# Patient Record
Sex: Male | Born: 1985
Health system: Southern US, Community
[De-identification: ages and names within clinical notes are randomized; demographics above are authoritative.]

## PROBLEM LIST (undated history)

## (undated) DIAGNOSIS — I951 Orthostatic hypotension: Secondary | ICD-10-CM

## (undated) DIAGNOSIS — I498 Other specified cardiac arrhythmias: Secondary | ICD-10-CM

## (undated) DIAGNOSIS — G90A Postural orthostatic tachycardia syndrome (POTS): Secondary | ICD-10-CM

## (undated) DIAGNOSIS — R000 Tachycardia, unspecified: Secondary | ICD-10-CM

## (undated) DIAGNOSIS — T7840XA Allergy, unspecified, initial encounter: Secondary | ICD-10-CM

## (undated) HISTORY — PX: FOOT SURGERY: SHX648

## (undated) HISTORY — DX: Allergy, unspecified, initial encounter: T78.40XA

## (undated) HISTORY — PX: WISDOM TOOTH EXTRACTION: SHX21

## (undated) HISTORY — PX: NOSE SURGERY: SHX723

---
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
## 2007-12-19 ENCOUNTER — Ambulatory Visit: Payer: Self-pay | Admitting: Family Medicine

## 2007-12-30 ENCOUNTER — Encounter: Payer: Self-pay | Admitting: Internal Medicine

## 2008-01-08 ENCOUNTER — Ambulatory Visit: Payer: Self-pay | Admitting: Family Medicine

## 2008-02-16 ENCOUNTER — Ambulatory Visit: Payer: Self-pay | Admitting: Family Medicine

## 2008-02-16 DIAGNOSIS — S90569A Insect bite (nonvenomous), unspecified ankle, initial encounter: Secondary | ICD-10-CM

## 2008-02-16 DIAGNOSIS — W57XXXA Bitten or stung by nonvenomous insect and other nonvenomous arthropods, initial encounter: Secondary | ICD-10-CM | POA: Insufficient documentation

## 2008-04-30 ENCOUNTER — Ambulatory Visit: Payer: Self-pay | Admitting: Internal Medicine

## 2008-04-30 DIAGNOSIS — I499 Cardiac arrhythmia, unspecified: Secondary | ICD-10-CM | POA: Insufficient documentation

## 2008-04-30 DIAGNOSIS — R5383 Other fatigue: Secondary | ICD-10-CM

## 2008-04-30 DIAGNOSIS — R5381 Other malaise: Secondary | ICD-10-CM

## 2008-05-14 ENCOUNTER — Ambulatory Visit: Payer: Self-pay | Admitting: Internal Medicine

## 2008-06-01 ENCOUNTER — Ambulatory Visit: Payer: Self-pay | Admitting: Family Medicine

## 2008-06-01 LAB — CONVERTED CEMR LAB
Bacteria, UA: 0
Bilirubin Urine: NEGATIVE
Glucose, Urine, Semiquant: NEGATIVE
Ketones, urine, test strip: NEGATIVE
Specific Gravity, Urine: >=1.03
Urobilinogen, UA: 0.2
pH: 5

## 2008-06-25 ENCOUNTER — Ambulatory Visit: Payer: Self-pay | Admitting: Internal Medicine

## 2009-01-19 ENCOUNTER — Ambulatory Visit: Payer: Self-pay | Admitting: Family Medicine

## 2009-01-27 ENCOUNTER — Telehealth: Payer: Self-pay | Admitting: Family Medicine

## 2009-03-17 ENCOUNTER — Emergency Department (HOSPITAL_COMMUNITY): Admission: EM | Admit: 2009-03-17 | Discharge: 2009-03-17 | Payer: Self-pay | Admitting: Emergency Medicine

## 2010-05-13 ENCOUNTER — Emergency Department (HOSPITAL_COMMUNITY): Admission: EM | Admit: 2010-05-13 | Discharge: 2010-05-13 | Payer: Self-pay | Admitting: Emergency Medicine

## 2010-11-12 LAB — CONVERTED CEMR LAB
ALT: 31 units/L (ref 0–53)
AST: 25 units/L (ref 0–37)
Alkaline Phosphatase: 54 units/L (ref 39–117)
Bilirubin, Direct: 0.1 mg/dL (ref 0.0–0.3)
CO2: 31 meq/L (ref 19–32)
Calcium: 9.8 mg/dL (ref 8.4–10.5)
Cholesterol: 172 mg/dL (ref 0–200)
Direct LDL: 108.5 mg/dL
GFR calc Af Amer: 123 mL/min
GFR calc non Af Amer: 101 mL/min
Glucose, Bld: 89 mg/dL (ref 70–99)
HCT: 48.6 % (ref 39.0–52.0)
HDL: 27.3 mg/dL — ABNORMAL LOW (ref >39.0)
Monocytes Absolute: 0.8 10*3/uL (ref 0.1–1.0)
Monocytes Relative: 9.9 % (ref 3.0–12.0)
Neutro Abs: 5.2 10*3/uL (ref 1.4–7.7)
Neutrophils Relative %: 61.7 % (ref 43.0–77.0)
Platelets: 229 10*3/uL (ref 150–400)
RDW: 11.5 % (ref 11.5–14.6)
TSH: 0.64 microintl units/mL (ref 0.35–5.50)
Total Bilirubin: 0.9 mg/dL (ref 0.3–1.2)
Total Protein: 7.2 g/dL (ref 6.0–8.3)
WBC, blood: 7.1 10*3/uL

## 2011-01-22 LAB — COMPREHENSIVE METABOLIC PANEL
BUN: 17 mg/dL (ref 6–23)
CO2: 23 mEq/L (ref 19–32)
Calcium: 9.1 mg/dL (ref 8.4–10.5)
Glucose, Bld: 93 mg/dL (ref 70–99)
Sodium: 137 mEq/L (ref 135–145)
Total Bilirubin: 1.1 mg/dL (ref 0.3–1.2)

## 2011-01-22 LAB — DIFFERENTIAL
Basophils Absolute: 0 10*3/uL (ref 0.0–0.1)
Basophils Relative: 0 % (ref 0–1)
Lymphs Abs: 1.3 10*3/uL (ref 0.7–4.0)
Monocytes Absolute: 0.6 10*3/uL (ref 0.1–1.0)
Neutro Abs: 8.8 10*3/uL — ABNORMAL HIGH (ref 1.7–7.7)
Neutrophils Relative %: 81 % — ABNORMAL HIGH (ref 43–77)

## 2011-01-22 LAB — LIPASE, BLOOD: Lipase: 21 U/L (ref 11–59)

## 2011-01-22 LAB — CBC
HCT: 45.8 % (ref 39.0–52.0)
MCV: 87.1 fL (ref 78.0–100.0)
RBC: 5.26 MIL/uL (ref 4.22–5.81)

## 2011-02-27 NOTE — Letter (Signed)
May 14, 2008    Billie D. Jillyn Hidden, FNP  [address] [place], [place] [address]   RE:  [name], [name]  MRN:  [id number]  /  DOB:  December 30, 1985   Dear Willaim Sheng:   Thanks very much for asking [place] to see Mike Nolan who came in today  with his mother regarding his spells.   As you know, he is a 25 year old whose sister died of, what was  apparently, a postpartum cardiomyopathy, although these details are not  available.   Mike Nolan has had spells over the last number of months that are  characterized initially by his fingers tingling.  He develops a  headache, sensation that his heart is beating, and he describes this is  at about 90-100 beats per minute.  He gets short of breath.  He gets  quite pale, diaphoretic, and has to lie down.  If he gets up too fast,  he gets lightheaded again and he needs to stay flat.   He also has a history of orthostatic intolerance which goes back a  number of months longer than this, which can be quite profound.  It can  also be associated with lightheadedness and shortness of breath in the  shower.   These 3 most severe episodes that can last up to 10-30 minutes have  occurred in the following situations:  1. At a go-cart race outside, it was very hot and he had been drinking      number of beers.  2. During sex, although he had just taken a phosphodiesterase      inhibitor.  3. The third episode occurred getting out of the [place].  He has had      problems with Jacuzzi intolerance before.   His diet is salt deplete as well as volume deplete.  He does use some  caffeine; though he is trying to cut it now.   PAST MEDICAL HISTORY:  Largely unrelated.   FAMILY HISTORY:  Striking, as relates to above.  He was evaluated at the  time of his sister's death with an echo that was normal and  electrocardiogram that was normal.   PAST SURGICAL HISTORY:  Notable for wisdom teeth and myringotomy tubes.   MEDICATIONS:  He takes no medications.   ALLERGIES:  He has no known drug allergies.   SOCIAL HISTORY:  He smokes just a couple of cigarettes a day.  He does  not use recreational drugs.  He does drink alcohol occasionally.  He is  single, and I did not ask him about his occupation but he did not write  anything down.   PHYSICAL EXAMINATION:  GENERAL:  He is a young Caucasian male appearing  his stated age of 48.  VITAL SIGNS:  His blood pressure is 123/76 with a pulse of 66 lying,  standing at 0 minute, the blood pressure was stable with a pulse of 82  and at 5 minutes, the pulse has gone to 90 with a blood pressure 125/85,  and his feet gained a blue tinge.  HEENT:  Demonstrated no icterus or xanthoma.  NECK:  Veins were flat.  Carotids were brisk and full bilaterally  without bruits.  BACK:  Without kyphosis and scoliosis.  LUNGS:  Clear.  HEART:  Sounds were regular without murmurs or gallops.  ABDOMEN:  Soft with active bowel sounds without midline pulsation or  hepatomegaly.  Femoral pulses were 2+ and distal pulses were intact.  EXTREMITIES:  There was  no clubbing, cyanosis, or edema.  SKIN:  Warm and dry, although there were some unusual body jewelry.  The  extremities were without edema.  NEUROLOGICAL:  Grossly normal.   Electrocardiogram dated, which I think is April 30, 2008, demonstrated  sinus rhythm at 57 with intervals of 0.14/0.11/0.38.  Electrocardiogram  was normal.   IMPRESSION:  1. 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  2. Family history of death in his sister as a consequence of a      cardiomyopathy, the mechanism of which is not clear and hence good      deal of related anxiety.  3. Normal electrocardiogram.   [name], I suspect, is dysautonomic.  I have given him the  information from [facility] and [employer] to look at to see if this does  not feel  consistent with his symptoms, but his story certainly suggests  this.  The provocative situations support this as well as his  orthostatic intolerance.  His diet is salt deplete and volume deplete,  and so the primary goal of therapy is volume repletion and salt  repletion and the avoidance of diuretics including in the form of  caffeine.   I have gone over this with him.  Because of the family history, we will  plan to get an event recorder to make sure that there is not an  arrhythmic cause that could be functioning as a trigger.  I will plan to  sit down with the family to review this in 4 weeks' time.   Thank you very much for the consultation.    Sincerely,      Duke Salvia, MD, [facility]  Electronically Signed    SCK/MedQ  DD: 05/14/2008  DT: 05/15/2008  Job #: [id number]

## 2011-02-27 NOTE — Letter (Signed)
June 25, 2008    Billie D. Jillyn Hidden, FNP  [address] [place], [place] [address]   RE:  KERWIN, AUGUSTUS  MRN:  [id number]  /  DOB:  1986/04/05   Dear Willaim Sheng,   It is a pleasure seeing Phelix Fudala today.  He is doing better somewhat  with fewer palpitations.  We did use event recorder and these episodes  of typical palpitations were associated with sinus rhythm probably in  the rates of 80-120.   He is somewhat more volume and salt repleted.  He mentioned that you had  told him that he should limit his Gatorade intake.  I am not sure that  you may not know more about rehydration or something since having heard  that or no.  The way I think of it is sodium absorption in the gut  requires glucose in vitro transport.  There are issues with caloric  intake and we did talk about that.   He is going to continue to work on salt and water repletion.  I told him  that I will be glad to see him again either as needed or scheduled, he  preferred the former.   Should note that on examination today his weight was up 7 pounds from  227 to 234.  His blood pressure was 119/74.  His pulse was 75.  His  lungs were clear.  His heart sounds were regular.  Extremities were  without edema.   Please let me know if there is any further I could do to help.    Sincerely,      Duke Salvia, MD, [facility]  Electronically Signed    SCK/MedQ  DD: 06/25/2008  DT: 06/26/2008  Job #: [id number]

## 2012-04-17 ENCOUNTER — Emergency Department (HOSPITAL_COMMUNITY): Payer: Medicaid Other

## 2012-04-17 ENCOUNTER — Emergency Department (HOSPITAL_COMMUNITY)
Admission: EM | Admit: 2012-04-17 | Discharge: 2012-04-17 | Disposition: A | Discharge status: Home / Self Care | Payer: Medicaid Other | Attending: Emergency Medicine | Admitting: Emergency Medicine

## 2012-04-17 ENCOUNTER — Encounter (HOSPITAL_COMMUNITY): Payer: Self-pay | Admitting: *Deleted

## 2012-04-17 DIAGNOSIS — G8929 Other chronic pain: Secondary | ICD-10-CM | POA: Insufficient documentation

## 2012-04-17 DIAGNOSIS — T622X1A Toxic effect of other ingested (parts of) plant(s), accidental (unintentional), initial encounter: Secondary | ICD-10-CM | POA: Insufficient documentation

## 2012-04-17 DIAGNOSIS — L237 Allergic contact dermatitis due to plants, except food: Secondary | ICD-10-CM

## 2012-04-17 DIAGNOSIS — L255 Unspecified contact dermatitis due to plants, except food: Secondary | ICD-10-CM | POA: Insufficient documentation

## 2012-04-17 DIAGNOSIS — F17200 Nicotine dependence, unspecified, uncomplicated: Secondary | ICD-10-CM | POA: Insufficient documentation

## 2012-04-17 MED ORDER — DIPHENHYDRAMINE HCL 25 MG PO CAPS
25.0000 mg | ORAL_CAPSULE | Freq: Once | ORAL | Status: AC
  Administered 2012-04-17: 25 mg via ORAL
  Filled 2012-04-17: qty 1

## 2012-04-17 MED ORDER — NAPROXEN 500 MG PO TABS: 500.0000 mg | ORAL_TABLET | Freq: Two times a day (BID) | ORAL | Status: DC

## 2012-04-17 MED ORDER — DIPHENHYDRAMINE HCL 25 MG PO TABS: 25.0000 mg | ORAL_TABLET | Freq: Four times a day (QID) | ORAL | Status: DC

## 2012-04-17 NOTE — ED Provider Notes (Signed)
History   This chart was scribed for Mike Octave, MD by Toya Smothers. The patient was seen in room TR04C/TR04C. Patient's care was started at 1836.  CSN: [id number]  Arrival date & time 04/17/12  1836   First MD Initiated Contact with Patient 04/17/12 1901      Chief Complaint  Patient presents with  . multiple complaints    HPI  Mike Nolan is a 26 y.o. male who presents to the Emergency Department complaining of gradual onset moderate severe constant rash onset 4 days ago of which he believes to be poison oak. He has be taking cortisone with no relief. Pt also c/o chronic radiating lower back pain onset several years of which he has been taking tramadol. Pt states that tramadol is not working. Pt also c/o L knee pain onset a few years ago. Pt lists a h/o POTS and Surgery in L foot.  Pt lists PCP as Dr. Juanetta Snow   History reviewed. No pertinent past medical history.  History reviewed. No pertinent past surgical history.  No family history on file.  History  Substance Use Topics  . Smoking status: Current Everyday Smoker  . Smokeless tobacco: Not on file  . Alcohol Use: Yes    Review of Systems  Constitutional: Negative for fever and chills.  HENT: Negative for rhinorrhea and neck pain.   Eyes: Negative for pain.  Respiratory: Negative for cough and shortness of breath.   Cardiovascular: Negative for chest pain.  Gastrointestinal: Negative for nausea, vomiting, abdominal pain and diarrhea.  Genitourinary: Negative for dysuria.  Musculoskeletal: Positive for back pain and arthralgias (L knee).  Skin: Positive for rash.       Redness.  Neurological: Negative for dizziness and weakness.    Allergies  Review of patient's allergies indicates no known allergies.  Home Medications   Current Outpatient Rx  Name Route Sig Dispense Refill  . CETIRIZINE HCL 10 MG PO TABS Oral Take 10 mg by mouth daily.    . TRAMADOL HCL 50 MG PO TABS Oral Take 50 mg by mouth every  6 (six) hours as needed. For pain    . DIPHENHYDRAMINE HCL 25 MG PO TABS Oral Take 1 tablet (25 mg total) by mouth every 6 (six) hours. 20 tablet 0  . NAPROXEN 500 MG PO TABS Oral Take 1 tablet (500 mg total) by mouth 2 (two) times daily. 30 tablet 0    BP 118/66  Pulse 58  Temp 98.2 F (36.8 C) (Oral)  Resp 16  SpO2 98%  Physical Exam  Nursing note and vitals reviewed. Constitutional: He is oriented to person, place, and time.       Awake, alert, nontoxic appearance with baseline speech for patient.  HENT:  Head: Atraumatic.  Mouth/Throat: No oropharyngeal exudate.  Eyes: EOM are normal. Pupils are equal, round, and reactive to light. Right eye exhibits no discharge. Left eye exhibits no discharge.  Neck: Neck supple.  Cardiovascular: Normal rate and regular rhythm.   No murmur heard. Pulmonary/Chest: Effort normal and breath sounds normal. No stridor. No respiratory distress.  Abdominal: Soft. Bowel sounds are normal. He exhibits no distension and no mass. There is no tenderness. There is no rebound.  Musculoskeletal: He exhibits no tenderness.       5/5 strength throughout. Gt extension bilaterally. +2 pulses. L knee diffusely tender to palpation without overlying skin deformity.  Lymphadenopathy:    He has no cervical adenopathy.  Neurological: He is alert and oriented to  person, place, and time. No cranial nerve deficit. Coordination normal.  Skin:       Erythematosa vesicular rash of the bilateral shins  Psychiatric: He has a normal mood and affect.    ED Course  Procedures (including critical care time) DIAGNOSTIC STUDIES: Oxygen Saturation is 98% on room air, normal by my interpretation.    COORDINATION OF CARE: 1912- Evaluated Pt's present illness. Discussed referrals to another PCP.   Labs Reviewed - No data to display Dg Hip Complete Left  04/17/2012  *RADIOLOGY REPORT*  Clinical Data: Chronic posterior left hip pain.  LEFT HIP - COMPLETE 2+ VIEW  Comparison:  None.  Findings: Hip joints and SI joints are symmetric and unremarkable. No acute bony abnormality.  Specifically, no fracture, subluxation, or dislocation.  Soft tissues are intact.  IMPRESSION: Normal study.  Original Report Authenticated By: Cyndie Chime, M.D.   Dg Knee Complete 4 Views Left  04/17/2012  *RADIOLOGY REPORT*  Clinical Data: Chronic patellar pain.  LEFT KNEE - COMPLETE 4+ VIEW  Comparison: None.  Findings: Mild irregularity at the anterior tibial tuberosity. No acute bony abnormality.  Specifically, no fracture, subluxation, or dislocation.  Soft tissues are intact.  No joint effusion.  IMPRESSION: No acute bony abnormality.  Original Report Authenticated By: Cyndie Chime, M.D.     1. Poison ivy dermatitis   2. Chronic pain       MDM  Itchy red rash to BLE x 3 days after getting in poison ivy.  No SOB or wheezing.  Using triamcinolone and prednisone. Also c/o intermittent knee pain for "years" and L hip pain since "falling in seventh grade".  No neuro deficits. Goals and expectations of ED visit set.  Will treat acute problems and rule out life threatening conditions.     I personally performed the services described in this documentation, which was scribed in my presence.  The recorded information has been reviewed and considered.    Mike Octave, MD 04/17/12 2009

## 2012-04-17 NOTE — ED Notes (Signed)
The pt has poison ivy rash for 3 days lower back pain for months and lt knee pain for months also

## 2012-04-18 ENCOUNTER — Emergency Department (HOSPITAL_COMMUNITY)
Admission: EM | Admit: 2012-04-18 | Discharge: 2012-04-18 | Disposition: A | Discharge status: Home / Self Care | Payer: Medicaid Other | Attending: Emergency Medicine | Admitting: Emergency Medicine

## 2012-04-18 ENCOUNTER — Encounter (HOSPITAL_COMMUNITY): Payer: Self-pay | Admitting: *Deleted

## 2012-04-18 DIAGNOSIS — L255 Unspecified contact dermatitis due to plants, except food: Secondary | ICD-10-CM

## 2012-04-18 DIAGNOSIS — T622X1A Toxic effect of other ingested (parts of) plant(s), accidental (unintentional), initial encounter: Secondary | ICD-10-CM | POA: Insufficient documentation

## 2012-04-18 HISTORY — DX: Orthostatic hypotension: I95.1

## 2012-04-18 HISTORY — DX: Tachycardia, unspecified: R00.0

## 2012-04-18 HISTORY — DX: Other specified cardiac arrhythmias: I49.8

## 2012-04-18 HISTORY — DX: Postural orthostatic tachycardia syndrome (POTS): G90.A

## 2012-04-18 MED ORDER — DEXAMETHASONE SODIUM PHOSPHATE 4 MG/ML IJ SOLN
10.0000 mg | Freq: Once | INTRAMUSCULAR | Status: AC
  Administered 2012-04-18: 10 mg via INTRAMUSCULAR
  Filled 2012-04-18: qty 3

## 2012-04-18 NOTE — ED Notes (Signed)
Poison ivy rash noted to both legs, c/o itching

## 2012-04-18 NOTE — ED Provider Notes (Signed)
History     CSN: [id number]  Arrival date & time 04/18/12  2144   First MD Initiated Contact with Patient 04/18/12 2157      Chief Complaint  Patient presents with  . Poison Oak    (Consider location/radiation/quality/duration/timing/severity/associated sxs/prior treatment) HPI Comments: patient c/o itching and rash to his bilateral arms and legs for 4 days.  States he was recently seen at another ER and given benadryl and has been using triamcinolone cream without improvement.  He is requesting a "shot of steroids".  He denies fever, pain, swelling, wheezing or shortness of breath  Patient is a 26 y.o. male presenting with rash. The history is provided by the patient.  Rash  This is a new problem. The current episode started more than 2 days ago. The problem has not changed since onset.The problem is associated with plant contact. There has been no fever. The rash is present on the left lower leg, left upper leg, right lower leg, right upper leg, right arm and left arm. The patient is experiencing no pain. Associated symptoms include itching. Pertinent negatives include no blisters, no pain and no weeping. He has tried anti-itch cream and antihistamines for the symptoms. The treatment provided no relief.    Past Medical History  Diagnosis Date  . Postural orthostatic tachycardia syndrome     Past Surgical History  Procedure Date  . Foot surgery   . Nose surgery   . Wisdom tooth extraction     No family history on file.  History  Substance Use Topics  . Smoking status: Current Everyday Smoker  . Smokeless tobacco: Not on file  . Alcohol Use: Yes      Review of Systems  Constitutional: Negative for fever, chills, activity change and appetite change.  HENT: Negative for sore throat, facial swelling, trouble swallowing, neck pain and neck stiffness.   Respiratory: Negative for chest tightness, shortness of breath and wheezing.   Musculoskeletal: Negative for arthralgias.    Skin: Positive for itching and rash. Negative for wound.  Neurological: Negative for dizziness, weakness, numbness and headaches.  All other systems reviewed and are negative.    Allergies  Honey bee treatment  Home Medications   Current Outpatient Rx  Name Route Sig Dispense Refill  . CETIRIZINE HCL 10 MG PO TABS Oral Take 10 mg by mouth daily.    Marland Kitchen DIPHENHYDRAMINE HCL 25 MG PO TABS Oral Take 1 tablet (25 mg total) by mouth every 6 (six) hours. 20 tablet 0  . ESCITALOPRAM OXALATE 20 MG PO TABS Oral Take 20 mg by mouth daily.    Marland Kitchen HYDROCORTISONE 1 % EX CREA Topical Apply 1 application topically daily as needed.    Marland Kitchen TRAMADOL HCL 50 MG PO TABS Oral Take 50 mg by mouth every 6 (six) hours as needed. For pain      BP 110/56  Pulse 75  Temp 98 F (36.7 C)  Resp 20  Ht 6' (1.829 m)  Wt 224 lb (101.606 kg)  BMI 30.38 kg/m2  SpO2 96%  Physical Exam  Nursing note and vitals reviewed. Constitutional: He is oriented to person, place, and time. He appears well-developed and well-nourished. No distress.  HENT:  Head: Normocephalic and atraumatic.  Mouth/Throat: Oropharynx is clear and moist.  Neck: Normal range of motion. Neck supple.  Cardiovascular: Normal rate, regular rhythm and normal heart sounds.   Pulmonary/Chest: Effort normal and breath sounds normal.  Musculoskeletal: He exhibits no edema and no tenderness.  Lymphadenopathy:  He has no cervical adenopathy.  Neurological: He is alert and oriented to person, place, and time. He exhibits normal muscle tone. Coordination normal.  Skin: Rash noted. There is erythema.       Erythematous, vesicular rash to bilateral LE"s and upper arms.  No edema.  No draiange or pustules    ED Course  Procedures (including critical care time)  Labs Reviewed - No data to display Dg Hip Complete Left  04/17/2012  *RADIOLOGY REPORT*  Clinical Data: Chronic posterior left hip pain.  LEFT HIP - COMPLETE 2+ VIEW  Comparison: None.  Findings:  Hip joints and SI joints are symmetric and unremarkable. No acute bony abnormality.  Specifically, no fracture, subluxation, or dislocation.  Soft tissues are intact.  IMPRESSION: Normal study.  Original Report Authenticated By: Cyndie Chime, M.D.   Dg Knee Complete 4 Views Left  04/17/2012  *RADIOLOGY REPORT*  Clinical Data: Chronic patellar pain.  LEFT KNEE - COMPLETE 4+ VIEW  Comparison: None.  Findings: Mild irregularity at the anterior tibial tuberosity. No acute bony abnormality.  Specifically, no fracture, subluxation, or dislocation.  Soft tissues are intact.  No joint effusion.  IMPRESSION: No acute bony abnormality.  Original Report Authenticated By: Cyndie Chime, M.D.        MDM  Patient seen in ER yesterday for similar symptoms. ED chart and imaging were reviewed by me.  Patchy ,vesicular lesions to the bilateral lower extremities.  Using triamcinolone cream without improvement. Currently taking Benadryl for the itching. No edema, shortness of breath, wheezing or drainage.   Patient / Family / Caregiver understand and agree with initial ED impression and plan with expectations set for ED visit. Pt stable in ED with no significant deterioration in condition. Pt feels improved after observation and/or treatment in ED.     Given Decadron IM .  Agrees to f/u with his PMD for recheck  Rmoni Keplinger L. Meyers, [place] 04/22/12 2157

## 2012-04-18 NOTE — ED Notes (Signed)
?   Poison oak to bilateral lower legs and arms, started 4 days ago after he was out working in the yard

## 2012-04-23 NOTE — ED Provider Notes (Signed)
Medical screening examination/treatment/procedure(s) were performed by non-physician practitioner and as supervising physician I was immediately available for consultation/collaboration.   Laray Anger, DO 04/23/12 1248

## 2012-08-19 ENCOUNTER — Encounter (HOSPITAL_COMMUNITY): Payer: Self-pay

## 2012-08-19 ENCOUNTER — Emergency Department (HOSPITAL_COMMUNITY)
Admission: EM | Admit: 2012-08-19 | Discharge: 2012-08-19 | Disposition: A | Discharge status: Home / Self Care | Payer: Medicaid Other | Attending: Emergency Medicine | Admitting: Emergency Medicine

## 2012-08-19 DIAGNOSIS — M545 Low back pain, unspecified: Secondary | ICD-10-CM | POA: Insufficient documentation

## 2012-08-19 DIAGNOSIS — F17200 Nicotine dependence, unspecified, uncomplicated: Secondary | ICD-10-CM | POA: Insufficient documentation

## 2012-08-19 DIAGNOSIS — M549 Dorsalgia, unspecified: Secondary | ICD-10-CM

## 2012-08-19 DIAGNOSIS — Z79899 Other long term (current) drug therapy: Secondary | ICD-10-CM | POA: Insufficient documentation

## 2012-08-19 DIAGNOSIS — Z8679 Personal history of other diseases of the circulatory system: Secondary | ICD-10-CM | POA: Insufficient documentation

## 2012-08-19 MED ORDER — MELOXICAM 7.5 MG PO TABS: ORAL_TABLET | ORAL | Status: DC

## 2012-08-19 MED ORDER — HYDROCODONE-ACETAMINOPHEN 5-325 MG PO TABS: 1.0000 | ORAL_TABLET | ORAL | Status: DC | PRN

## 2012-08-19 MED ORDER — METHOCARBAMOL 500 MG PO TABS: ORAL_TABLET | ORAL | Status: DC

## 2012-08-19 NOTE — ED Provider Notes (Signed)
History     CSN: [id number]  Arrival date & time 08/19/12  1251   First MD Initiated Contact with Patient 08/19/12 1331      No chief complaint on file.   (Consider location/radiation/quality/duration/timing/severity/associated sxs/prior treatment) Patient is a 26 y.o. male presenting with back pain. The history is provided by the patient.  Back Pain  This is a new problem. The current episode started 2 days ago. The problem has not changed since onset.The pain is associated with lifting heavy objects. The pain is present in the lumbar spine. The quality of the pain is described as aching. The pain radiates to the left thigh. The pain is severe. The symptoms are aggravated by bending. The pain is the same all the time. Pertinent negatives include no chest pain, no abdominal pain and no dysuria. He has tried analgesics (tramadol) for the symptoms. The treatment provided no relief.    Past Medical History  Diagnosis Date  . Postural orthostatic tachycardia syndrome     Past Surgical History  Procedure Date  . Foot surgery   . Nose surgery   . Wisdom tooth extraction     No family history on file.  History  Substance Use Topics  . Smoking status: Current Every Day Smoker  . Smokeless tobacco: Not on file  . Alcohol Use: No      Review of Systems  Constitutional: Negative for activity change.       All ROS Neg except as noted in HPI  HENT: Negative for nosebleeds and neck pain.   Eyes: Negative for photophobia and discharge.  Respiratory: Negative for cough, shortness of breath and wheezing.   Cardiovascular: Negative for chest pain and palpitations.  Gastrointestinal: Negative for abdominal pain and blood in stool.  Genitourinary: Negative for dysuria, frequency and hematuria.  Musculoskeletal: Positive for back pain. Negative for arthralgias.  Skin: Negative.   Neurological: Negative for dizziness, seizures and speech difficulty.  Psychiatric/Behavioral: Negative  for hallucinations and confusion.    Allergies  Bee venom  Home Medications   Current Outpatient Rx  Name  Route  Sig  Dispense  Refill  . VITAMIN C 1000 MG PO TABS   Oral   Take 1,000 mg by mouth daily.         . BUPROPION HCL ER (XL) 150 MG PO TB24   Oral   Take 150 mg by mouth daily.         . DAYQUIL PO   Oral   Take 2 capsules by mouth every 4 (four) hours as needed. Cold Symptoms           BP 135/77  Pulse 74  Temp 97.9 F (36.6 C) (Oral)  Resp 20  Ht 6' (1.829 m)  Wt 230 lb (104.327 kg)  BMI 31.19 kg/m2  SpO2 98%  Physical Exam  Nursing note and vitals reviewed. Constitutional: He is oriented to person, place, and time. He appears well-developed and well-nourished.  Non-toxic appearance.  HENT:  Head: Normocephalic.  Right Ear: Tympanic membrane and external ear normal.  Left Ear: Tympanic membrane and external ear normal.  Eyes: EOM and lids are normal. Pupils are equal, round, and reactive to light.  Neck: Normal range of motion. Neck supple. Carotid bruit is not present.  Cardiovascular: Normal rate, regular rhythm, normal heart sounds, intact distal pulses and normal pulses.   Pulmonary/Chest: Breath sounds normal. No respiratory distress.  Abdominal: Soft. Bowel sounds are normal. There is no tenderness. There is no  guarding.  Musculoskeletal: Normal range of motion.       Pain of the lumbar spine and Paraspinous area with ROM exercises. No hot areas   Lymphadenopathy:       Head (right side): No submandibular adenopathy present.       Head (left side): No submandibular adenopathy present.    He has no cervical adenopathy.  Neurological: He is alert and oriented to person, place, and time. He has normal strength. No cranial nerve deficit or sensory deficit.       Sensory is symmetrical. Gait is steady. No focal deficit.  Skin: Skin is warm and dry.  Psychiatric: He has a normal mood and affect. His speech is normal.    ED Course  Procedures  (including critical care time)  Labs Reviewed - No data to display No results found. Pulse oximetry is 98% on room air. Within normal limits by my interpretation.  No diagnosis found.    MDM  I have reviewed nursing notes, vital signs, and all appropriate lab and imaging results for this patient. Patient was lifting heavy objects this weekend and on the following day noted a pain in the lower back at times radiating into the left leg. He denies any foot drop. She's not had any falls or injury. Examination does not show any gross neurologic deficits at this time. The patient is treated with Robaxin 3 times daily, Mobic 7.5 mg 2 times daily with food, and Norco #20 tablets. The patient is to see orthopedics for evaluation if not improving.       Kathie Dike, [place] 08/20/12 [phone]

## 2012-08-19 NOTE — ED Notes (Signed)
Pt reports was helping a friend move this weekend and has had pain in lower back radiating down left leg since.

## 2012-08-20 NOTE — ED Provider Notes (Signed)
Medical screening examination/treatment/procedure(s) were performed by non-physician practitioner and as supervising physician I was immediately available for consultation/collaboration.   Kayce Betty L Brenten Janney, MD 08/20/12 1059 

## 2014-07-30 ENCOUNTER — Emergency Department (HOSPITAL_COMMUNITY): Payer: Self-pay

## 2014-07-30 ENCOUNTER — Encounter (HOSPITAL_COMMUNITY): Payer: Self-pay | Admitting: Emergency Medicine

## 2014-07-30 ENCOUNTER — Emergency Department (HOSPITAL_COMMUNITY)
Admission: EM | Admit: 2014-07-30 | Discharge: 2014-07-31 | Disposition: A | Discharge status: Home / Self Care | Payer: Self-pay | Attending: Emergency Medicine | Admitting: Emergency Medicine

## 2014-07-30 DIAGNOSIS — Z791 Long term (current) use of non-steroidal anti-inflammatories (NSAID): Secondary | ICD-10-CM | POA: Insufficient documentation

## 2014-07-30 DIAGNOSIS — S060X1A Concussion with loss of consciousness of 30 minutes or less, initial encounter: Secondary | ICD-10-CM | POA: Insufficient documentation

## 2014-07-30 DIAGNOSIS — Z79899 Other long term (current) drug therapy: Secondary | ICD-10-CM | POA: Insufficient documentation

## 2014-07-30 DIAGNOSIS — Y9364 Activity, baseball: Secondary | ICD-10-CM | POA: Insufficient documentation

## 2014-07-30 DIAGNOSIS — W01198A Fall on same level from slipping, tripping and stumbling with subsequent striking against other object, initial encounter: Secondary | ICD-10-CM | POA: Insufficient documentation

## 2014-07-30 DIAGNOSIS — Z8679 Personal history of other diseases of the circulatory system: Secondary | ICD-10-CM | POA: Insufficient documentation

## 2014-07-30 DIAGNOSIS — Y92310 Basketball court as the place of occurrence of the external cause: Secondary | ICD-10-CM | POA: Insufficient documentation

## 2014-07-30 NOTE — ED Provider Notes (Signed)
CSN: [id number]     Arrival date & time 07/30/14  2214 History   First MD Initiated Contact with Patient 07/30/14 2243    This chart was scribed for Glynn Octave, MD by Marica Otter, ED Scribe. This patient was seen in room APA05/[facility]05 and the patient's care was started at 10:52 PM.  Chief Complaint  Patient presents with  . Head Injury   The history is provided by the patient and a relative. No language interpreter was used.   PCP: No PCP Per Patient HPI Comments: Mike Nolan is a 28 y.o. male who presents to the Emergency Department complaining of a fall and associated head trauma sustained earlier this evening while playing softball. Pt was in the process of catching a ball when he fell backwards and hit the back of his head on the ground. Pt did not get hit in the head with the baseball. Pt cannot recall any of the specifics of the fall. Per family member, pt got up following the fall and returned to the game; however, pt could not recall what position he played. Per family member, pt insisted on driving home following the game, however, had difficulty remembering how to get home. Pt complains of mild neck pain and pain to the right side of the forehead. Per spouse, pt complained of a HA prior to arrival, however, pt now denies any HA. Pt denies n/v, dizziness, light headedness, blurred vision, double vision.   Past Medical History  Diagnosis Date  . Postural orthostatic tachycardia syndrome    Past Surgical History  Procedure Laterality Date  . Foot surgery    . Nose surgery    . Wisdom tooth extraction     History reviewed. No pertinent family history. History  Substance Use Topics  . Smoking status: Current Every Day Smoker  . Smokeless tobacco: Not on file  . Alcohol Use: No    Review of Systems  A complete 10 system review of systems was obtained and all systems are negative except as noted in the HPI and PMH.    Allergies  Bee venom  Home Medications   Prior  to Admission medications   Medication Sig Start Date End Date Taking? Authorizing Provider  Ascorbic Acid (VITAMIN C) 1000 MG tablet Take 1,000 mg by mouth daily.    Historical Provider, MD  buPROPion (WELLBUTRIN XL) 150 MG 24 hr tablet Take 150 mg by mouth daily.    Historical Provider, MD  HYDROcodone-acetaminophen (NORCO) 5-325 MG per tablet Take 1 tablet by mouth every 4 (four) hours as needed for pain. 08/19/12   Kathie Dike, PA-C  meloxicam (MOBIC) 7.5 MG tablet 1 po bid with food 08/19/12   Kathie Dike, PA-C  methocarbamol (ROBAXIN) 500 MG tablet 2 po tid for spasm 08/19/12   Kathie Dike, PA-C  Pseudoephedrine-APAP-DM (DAYQUIL PO) Take 2 capsules by mouth every 4 (four) hours as needed. Cold Symptoms    Historical Provider, MD   Triage Vitals: BP 126/85  Pulse 11  Temp(Src) 97.6 F (36.4 C) (Oral)  Resp 16  Ht 6' (1.829 m)  Wt 210 lb (95.255 kg)  BMI 28.47 kg/m2  SpO2 98% Physical Exam  Nursing note and vitals reviewed. Constitutional: He is oriented to person, place, and time. He appears well-developed and well-nourished. No distress.  HENT:  Head: Normocephalic and atraumatic.  Mouth/Throat: Oropharynx is clear and moist. No oropharyngeal exudate.  Tender posterior scalp, left side.   Eyes: Conjunctivae and EOM are  normal. Pupils are equal, round, and reactive to light.  Neck: Normal range of motion. Neck supple.  No meningismus.  Cardiovascular: Normal rate, regular rhythm, normal heart sounds and intact distal pulses.   No murmur heard. Pulmonary/Chest: Effort normal and breath sounds normal. No respiratory distress.  Abdominal: Soft. There is no tenderness. There is no rebound and no guarding.  Musculoskeletal: Normal range of motion. He exhibits no edema and no tenderness.  No C-spine pain.   Neurological: He is alert and oriented to person, place, and time. No cranial nerve deficit. He exhibits normal muscle tone. Coordination normal.  No ataxia on finger to  nose bilaterally. No pronator drift. 5/5 strength throughout. CN 2-12 intact. Negative Romberg. Equal grip strength. Sensation intact. Gait is normal.   Skin: Skin is warm.  Psychiatric: He has a normal mood and affect. His behavior is normal.    ED Course  Procedures (including critical care time) DIAGNOSTIC STUDIES: Oxygen Saturation is 98% on RA, nl by my interpretation.    COORDINATION OF CARE: 10:56 PM-Discussed treatment plan which includes imaging with pt at bedside and pt agreed to plan.   Labs Review Labs Reviewed - No data to display  Imaging Review Ct Head Wo Contrast  07/30/2014   CLINICAL DATA:  28 year old male with fall playing baseball striking back of head. Left temporal pain and mild posterior neck pain. Patient does not remember episode.  EXAM: CT HEAD WITHOUT CONTRAST  CT CERVICAL SPINE WITHOUT CONTRAST  TECHNIQUE: Multidetector CT imaging of the head and cervical spine was performed following the standard protocol without intravenous contrast. Multiplanar CT image reconstructions of the cervical spine were also generated.  COMPARISON:  None.  FINDINGS: CT HEAD FINDINGS  No skull fracture or intracranial hemorrhage.  No CT evidence of large acute infarct.  No hydrocephalus.  No intracranial mass lesion noted on this unenhanced exam.  Mastoid air cells, middle ear cavities and visualized paranasal sinuses are clear.  CT CERVICAL SPINE FINDINGS  Evaluation of the lower cervical spine and upper thoracic spine slightly limited by artifact caused by patient's habitus. No cervical spine fracture is noted.  Straightening of the cervical spine possibly related to head position or muscle spasm.  No abnormal prevertebral soft tissue swelling.  IMPRESSION: No skull fracture or intracranial hemorrhage.  No cervical spine fracture detected.  Please see above discussion.   Electronically Signed   By: Bridgett LarssonSteve  Olson M.D.   On: 07/30/2014 23:50   Ct Cervical Spine Wo Contrast  07/30/2014    CLINICAL DATA:  28 year old male with fall playing baseball striking back of head. Left temporal pain and mild posterior neck pain. Patient does not remember episode.  EXAM: CT HEAD WITHOUT CONTRAST  CT CERVICAL SPINE WITHOUT CONTRAST  TECHNIQUE: Multidetector CT imaging of the head and cervical spine was performed following the standard protocol without intravenous contrast. Multiplanar CT image reconstructions of the cervical spine were also generated.  COMPARISON:  None.  FINDINGS: CT HEAD FINDINGS  No skull fracture or intracranial hemorrhage.  No CT evidence of large acute infarct.  No hydrocephalus.  No intracranial mass lesion noted on this unenhanced exam.  Mastoid air cells, middle ear cavities and visualized paranasal sinuses are clear.  CT CERVICAL SPINE FINDINGS  Evaluation of the lower cervical spine and upper thoracic spine slightly limited by artifact caused by patient's habitus. No cervical spine fracture is noted.  Straightening of the cervical spine possibly related to head position or muscle spasm.  No abnormal  prevertebral soft tissue swelling.  IMPRESSION: No skull fracture or intracranial hemorrhage.  No cervical spine fracture detected.  Please see above discussion.   Electronically Signed   By: Bridgett LarssonSteve  Olson M.D.   On: 07/30/2014 23:50     EKG Interpretation None      MDM   Final diagnoses:  Concussion, with loss of consciousness of 30 minutes or less, initial encounter   Fall during softball striking posterior head. Possible loss of consciousness. Repetitive questioning with memory loss. Denies headache. No focal neurodeficits.  History and exam suspicious for concussion. CT head is negative.  Patient is ambulatory and tolerating by mouth.  Concussion diagnosis discussed with patient and family. Discussed avoiding reinjury.  discussed brain rest. Informed that he may expect some headaches, dizziness, nausea, memory issues that could last several weeks. Advised not to drive  while symptomatic. Follow up with PCP and referral to neurology given.   BP 116/82  Pulse 82  Temp(Src) 97.6 F (36.4 C) (Oral)  Resp 18  Ht 6' (1.829 m)  Wt 210 lb (95.255 kg)  BMI 28.47 kg/m2  SpO2 99%    I personally performed the services described in this documentation, which was scribed in my presence. The recorded information has been reviewed and is accurate.   Glynn OctaveStephen Marijane Trower, MD 07/31/14 [phone]

## 2014-07-30 NOTE — ED Notes (Addendum)
Pt was going to catch a ball and fell backwards and fell and hit the back of his head. Pt remembers very little. Family reports pt has a headache and has no nausea and vomiting. Pt states his head hurts in his left temporal area.

## 2014-07-31 NOTE — Discharge Instructions (Signed)
Concussion Avoid reinjury. Rest your brain as we discussed. Follow up with Dr. Gerilyn Pilgrim.  Return to the ED if you develop new or worsening symptoms. A concussion, or closed-head injury, is a brain injury caused by a direct blow to the head or by a quick and sudden movement (jolt) of the head or neck. Concussions are usually not life-threatening. Even so, the effects of a concussion can be serious. If you have had a concussion before, you are more likely to experience concussion-like symptoms after a direct blow to the head.  CAUSES  Direct blow to the head, such as from running into another player during a soccer game, being hit in a fight, or hitting your head on a hard surface.  A jolt of the head or neck that causes the brain to move back and forth inside the skull, such as in a car crash. SIGNS AND SYMPTOMS The signs of a concussion can be hard to notice. Early on, they may be missed by you, family members, and health care providers. You may look fine but act or feel differently. Symptoms are usually temporary, but they may last for days, weeks, or even longer. Some symptoms may appear right away while others may not show up for hours or days. Every head injury is different. Symptoms include:  Mild to moderate headaches that will not go away.  A feeling of pressure inside your head.  Having more trouble than usual:  Learning or remembering things you have heard.  Answering questions.  Paying attention or concentrating.  Organizing daily tasks.  Making decisions and solving problems.  Slowness in thinking, acting or reacting, speaking, or reading.  Getting lost or being easily confused.  Feeling tired all the time or lacking energy (fatigued).  Feeling drowsy.  Sleep disturbances.  Sleeping more than usual.  Sleeping less than usual.  Trouble falling asleep.  Trouble sleeping (insomnia).  Loss of balance or feeling lightheaded or dizzy.  Nausea or  vomiting.  Numbness or tingling.  Increased sensitivity to:  Sounds.  Lights.  Distractions.  Vision problems or eyes that tire easily.  Diminished sense of taste or smell.  Ringing in the ears.  Mood changes such as feeling sad or anxious.  Becoming easily irritated or angry for little or no reason.  Lack of motivation.  Seeing or hearing things other people do not see or hear (hallucinations). DIAGNOSIS Your health care provider can usually diagnose a concussion based on a description of your injury and symptoms. He or she will ask whether you passed out (lost consciousness) and whether you are having trouble remembering events that happened right before and during your injury. Your evaluation might include:  A brain scan to look for signs of injury to the brain. Even if the test shows no injury, you may still have a concussion.  Blood tests to be sure other problems are not present. TREATMENT  Concussions are usually treated in an emergency department, in urgent care, or at a clinic. You may need to stay in the hospital overnight for further treatment.  Tell your health care provider if you are taking any medicines, including prescription medicines, over-the-counter medicines, and natural remedies. Some medicines, such as blood thinners (anticoagulants) and aspirin, may increase the chance of complications. Also tell your health care provider whether you have had alcohol or are taking illegal drugs. This information may affect treatment.  Your health care provider will send you home with important instructions to follow.  How fast you  will recover from a concussion depends on many factors. These factors include how severe your concussion is, what part of your brain was injured, your age, and how healthy you were before the concussion.  Most people with mild injuries recover fully. Recovery can take time. In general, recovery is slower in older persons. Also, persons who  have had a concussion in the past or have other medical problems may find that it takes longer to recover from their current injury. HOME CARE INSTRUCTIONS General Instructions  Carefully follow the directions your health care provider gave you.  Only take over-the-counter or prescription medicines for pain, discomfort, or fever as directed by your health care provider.  Take only those medicines that your health care provider has approved.  Do not drink alcohol until your health care provider says you are well enough to do so. Alcohol and certain other drugs may slow your recovery and can put you at risk of further injury.  If it is harder than usual to remember things, write them down.  If you are easily distracted, try to do one thing at a time. For example, do not try to watch TV while fixing dinner.  Talk with family members or close friends when making important decisions.  Keep all follow-up appointments. Repeated evaluation of your symptoms is recommended for your recovery.  Watch your symptoms and tell others to do the same. Complications sometimes occur after a concussion. Older adults with a brain injury may have a higher risk of serious complications, such as a blood clot on the brain.  Tell your teachers, school nurse, school counselor, coach, athletic trainer, or work Production designer, theatre/television/filmmanager about your injury, symptoms, and restrictions. Tell them about what you can or cannot do. They should watch for:  Increased problems with attention or concentration.  Increased difficulty remembering or learning new information.  Increased time needed to complete tasks or assignments.  Increased irritability or decreased ability to cope with stress.  Increased symptoms.  Rest. Rest helps the brain to heal. Make sure you:  Get plenty of sleep at night. Avoid staying up late at night.  Keep the same bedtime hours on weekends and weekdays.  Rest during the day. Take daytime naps or rest breaks  when you feel tired.  Limit activities that require a lot of thought or concentration. These include:  Doing homework or job-related work.  Watching TV.  Working on the computer.  Avoid any situation where there is potential for another head injury (football, hockey, soccer, basketball, martial arts, downhill snow sports and horseback riding). Your condition will get worse every time you experience a concussion. You should avoid these activities until you are evaluated by the appropriate follow-up health care providers. Returning To Your Regular Activities You will need to return to your normal activities slowly, not all at once. You must give your body and brain enough time for recovery.  Do not return to sports or other athletic activities until your health care provider tells you it is safe to do so.  Ask your health care provider when you can drive, ride a bicycle, or operate heavy machinery. Your ability to react may be slower after a brain injury. Never do these activities if you are dizzy.  Ask your health care provider about when you can return to work or school. Preventing Another Concussion It is very important to avoid another brain injury, especially before you have recovered. In rare cases, another injury can lead to permanent brain damage, brain  swelling, or death. The risk of this is greatest during the first 7-10 days after a head injury. Avoid injuries by:  Wearing a seat belt when riding in a car.  Drinking alcohol only in moderation.  Wearing a helmet when biking, skiing, skateboarding, skating, or doing similar activities.  Avoiding activities that could lead to a second concussion, such as contact or recreational sports, until your health care provider says it is okay.  Taking safety measures in your home.  Remove clutter and tripping hazards from floors and stairways.  Use grab bars in bathrooms and handrails by stairs.  Place non-slip mats on floors and in  bathtubs.  Improve lighting in dim areas. SEEK MEDICAL CARE IF:  You have increased problems paying attention or concentrating.  You have increased difficulty remembering or learning new information.  You need more time to complete tasks or assignments than before.  You have increased irritability or decreased ability to cope with stress.  You have more symptoms than before. Seek medical care if you have any of the following symptoms for more than 2 weeks after your injury:  Lasting (chronic) headaches.  Dizziness or balance problems.  Nausea.  Vision problems.  Increased sensitivity to noise or light.  Depression or mood swings.  Anxiety or irritability.  Memory problems.  Difficulty concentrating or paying attention.  Sleep problems.  Feeling tired all the time. SEEK IMMEDIATE MEDICAL CARE IF:  You have severe or worsening headaches. These may be a sign of a blood clot in the brain.  You have weakness (even if only in one hand, leg, or part of the face).  You have numbness.  You have decreased coordination.  You vomit repeatedly.  You have increased sleepiness.  One pupil is larger than the other.  You have convulsions.  You have slurred speech.  You have increased confusion. This may be a sign of a blood clot in the brain.  You have increased restlessness, agitation, or irritability.  You are unable to recognize people or places.  You have neck pain.  It is difficult to wake you up.  You have unusual behavior changes.  You lose consciousness. MAKE SURE YOU:  Understand these instructions.  Will watch your condition.  Will get help right away if you are not doing well or get worse. Document Released: 12/22/2003 Document Revised: 10/06/2013 Document Reviewed: 04/23/2013 [facility] Patient Information 2015 [place], [place]. This information is not intended to replace advice given to you by your health care provider. Make sure you discuss any  questions you have with your health care provider.

## 2014-07-31 NOTE — ED Notes (Signed)
Discharge instructions given and reviewed with patient and spouse.  Both verbalized understanding to follow up with neurology and to return to ED for any worsening problems.  Patient ambulatory with steady gait; discharged home in good condition.

## 2015-11-20 IMAGING — CT CT CERVICAL SPINE W/O CM
3 of 6 series · 11 of 33 positions shown, 13 images · non-contrast
Comparison: None.

CLINICAL DATA: 28-year-old male with fall playing baseball striking
back of head. Left temporal pain and mild posterior neck pain.
Patient does not remember episode.

EXAM:
CT HEAD WITHOUT CONTRAST
CT CERVICAL SPINE WITHOUT CONTRAST
TECHNIQUE: Multidetector CT imaging of the head and cervical spine was
performed following the standard protocol without intravenous
contrast. Multiplanar CT image reconstructions of the cervical spine
were also generated.

[Series 7: sagittal bone 2.0 · sagittal · 0.23mm/px · 5 of 51 slices shown, 6 images]
[im 17/51  bone]
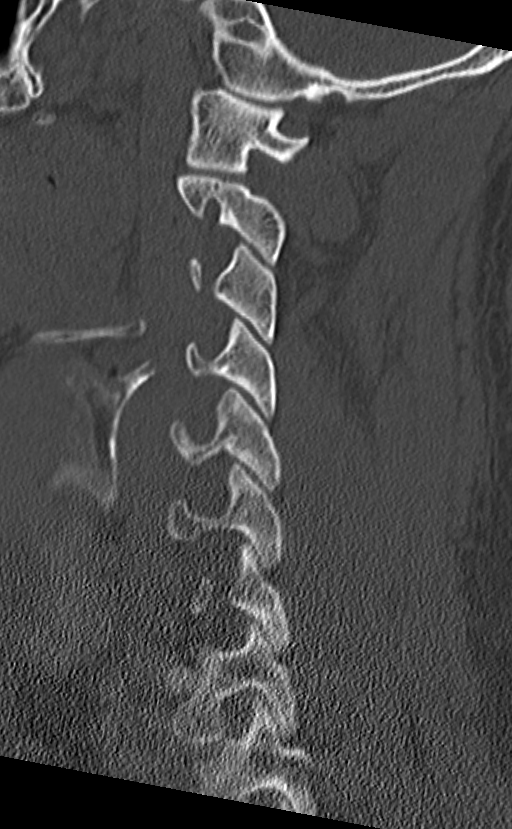
[im 21/51  bone]
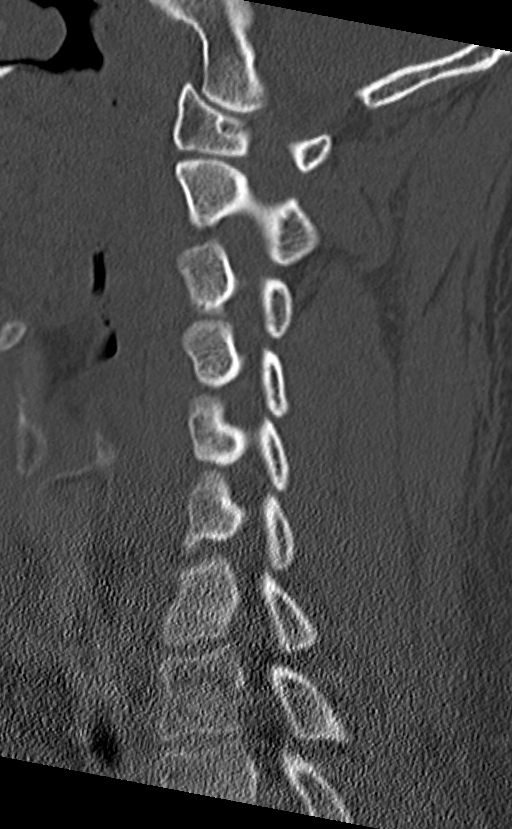
[im 26/51  soft-tissue]
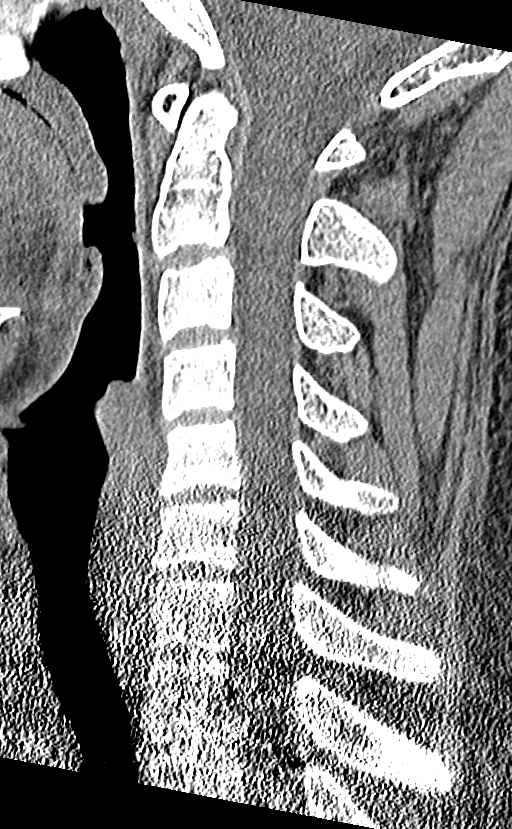
[im 26/51  bone]
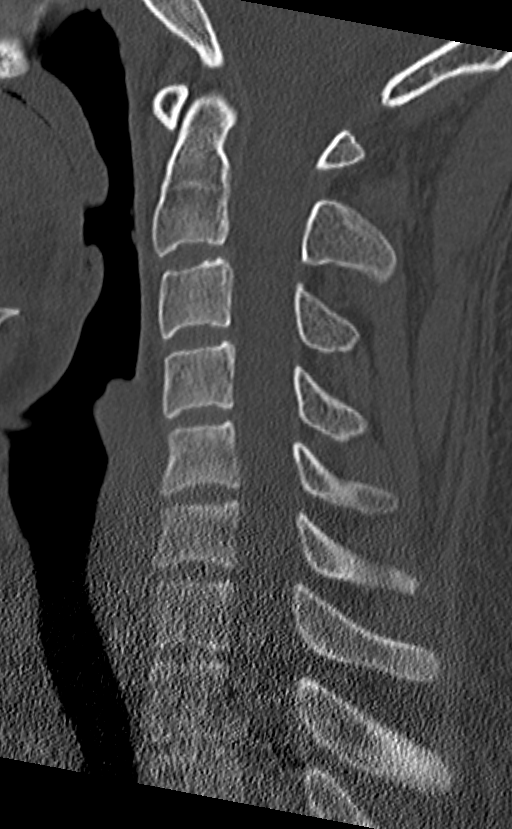
[im 30/51  bone]
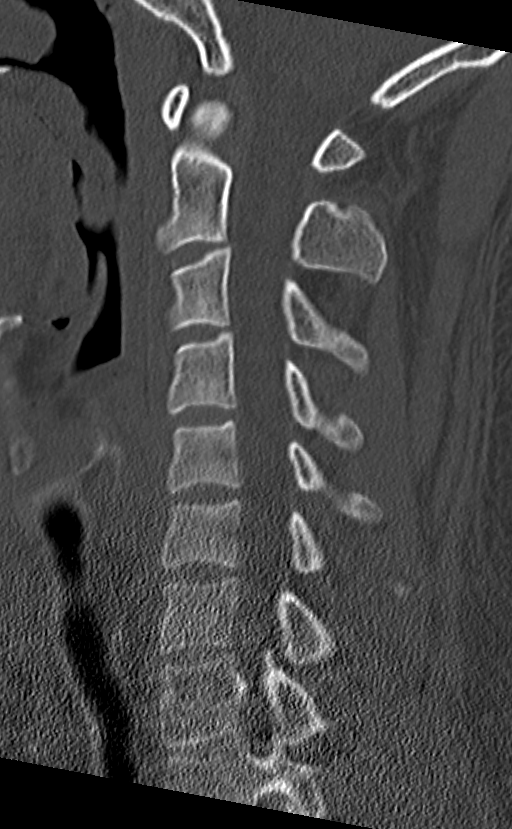
[im 34/51  bone]
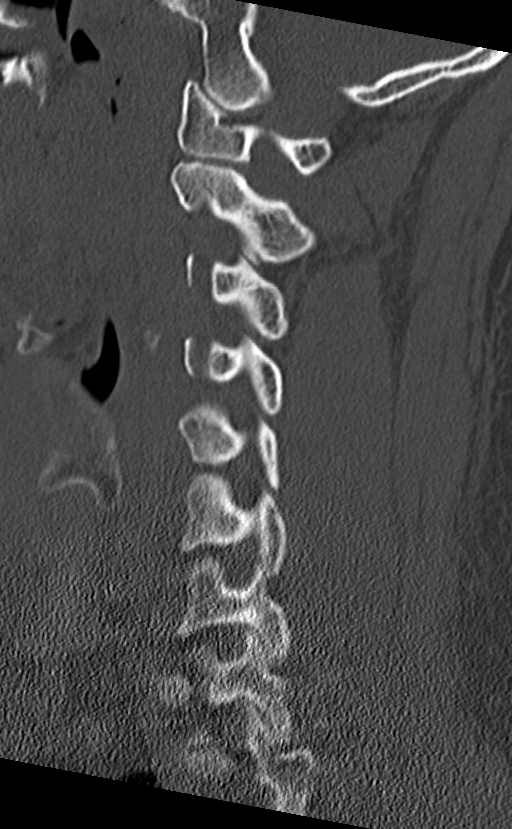

[Series 8: coronal bone 2.0 · coronal · 0.21mm/px · 3 of 54 slices shown]
[im 11/54  bone]
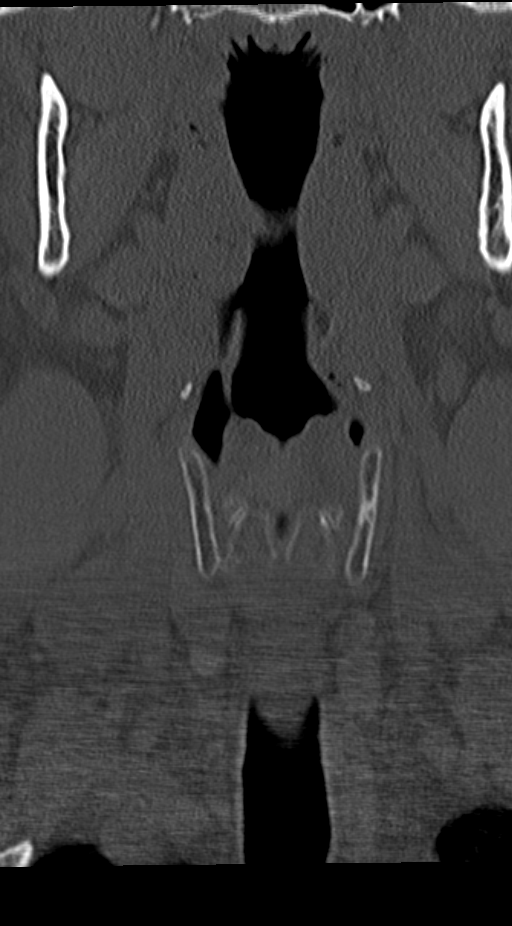
[im 22/54  bone]
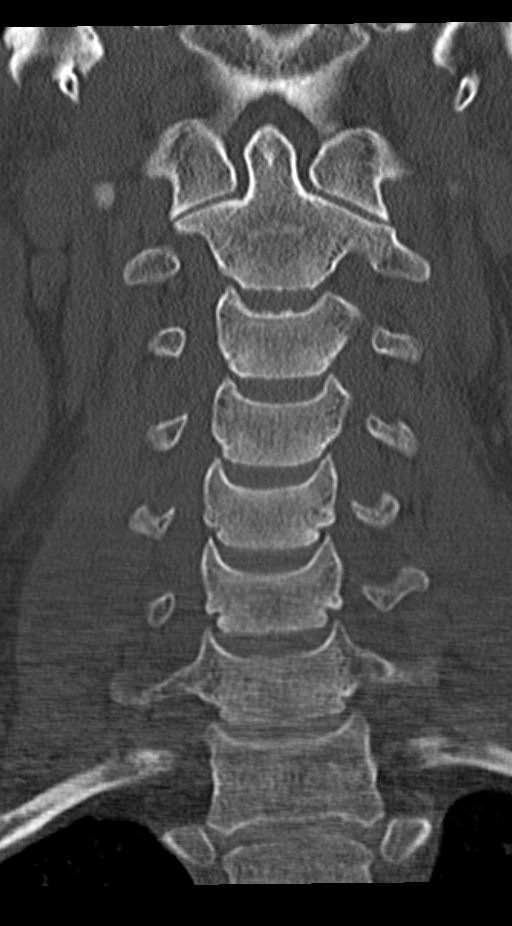
[im 32/54  bone]
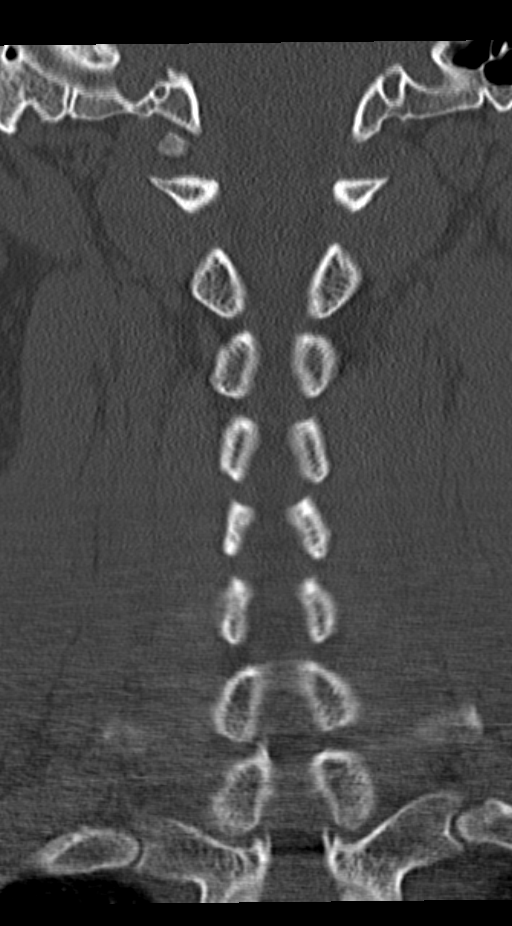

[Series 9: axial bone 2.0 · axial · 0.22mm/px · z∈[+71,+164]mm · 3 of 95 slices shown, 4 images]
[im 24/95  soft-tissue]
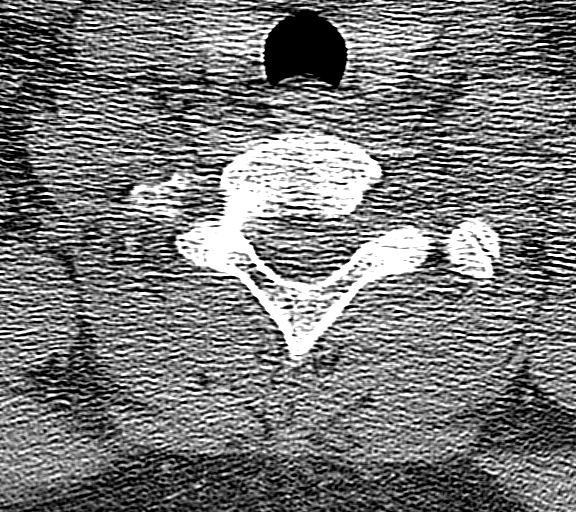
[im 24/95  bone]
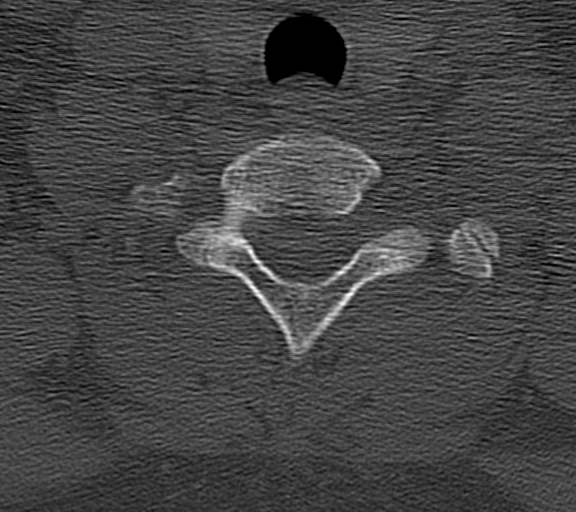
[im 48/95  bone]
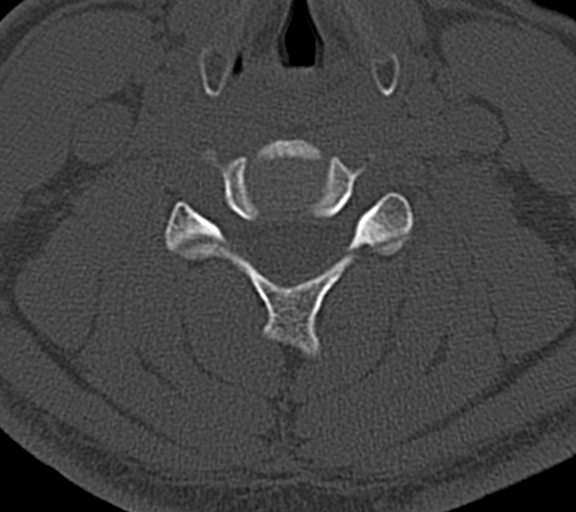
[im 71/95  bone]
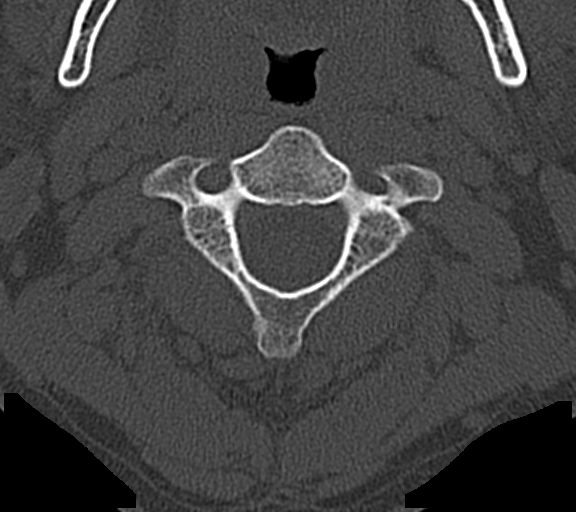

[11 of 33 positions shown; findings below may reference images not displayed]

FINDINGS: CT HEAD FINDINGS

No skull fracture or intracranial hemorrhage.

No CT evidence of large acute infarct.

No hydrocephalus.

No intracranial mass lesion noted on this unenhanced exam.

Mastoid air cells, middle ear cavities and visualized paranasal
sinuses are clear.

CT CERVICAL SPINE FINDINGS

Evaluation of the lower cervical spine and upper thoracic spine
slightly limited by artifact caused by patient's habitus. No
cervical spine fracture is noted.

Straightening of the cervical spine possibly related to head
position or muscle spasm.

No abnormal prevertebral soft tissue swelling.
IMPRESSION: No skull fracture or intracranial hemorrhage.

No cervical spine fracture detected.  Please see above discussion.

## 2017-11-25 ENCOUNTER — Ambulatory Visit
Admission: RE | Admit: 2017-11-25 | Discharge: 2017-11-25 | Disposition: A | Discharge status: Home / Self Care | Payer: 59 | Source: Ambulatory Visit | Attending: Nurse Practitioner | Admitting: Nurse Practitioner

## 2017-11-25 ENCOUNTER — Ambulatory Visit: Payer: 59 | Admitting: Nurse Practitioner

## 2017-11-25 ENCOUNTER — Encounter: Payer: Self-pay | Admitting: Nurse Practitioner

## 2017-11-25 VITALS — BP 122/80 | HR 66 | Resp 16 | Ht 72.0 in | Wt 264.0 lb

## 2017-11-25 DIAGNOSIS — M25562 Pain in left knee: Secondary | ICD-10-CM

## 2017-11-25 DIAGNOSIS — R222 Localized swelling, mass and lump, trunk: Secondary | ICD-10-CM | POA: Diagnosis present

## 2017-11-25 DIAGNOSIS — R0602 Shortness of breath: Secondary | ICD-10-CM | POA: Diagnosis not present

## 2017-11-25 DIAGNOSIS — J0140 Acute pansinusitis, unspecified: Secondary | ICD-10-CM

## 2017-11-25 DIAGNOSIS — R229 Localized swelling, mass and lump, unspecified: Secondary | ICD-10-CM | POA: Diagnosis not present

## 2017-11-25 MED ORDER — AZITHROMYCIN 250 MG PO TABS: ORAL_TABLET | ORAL | 0 refills | Status: DC

## 2017-11-25 NOTE — Progress Notes (Signed)
[facility] [address] [place], [place] [address]  Internal MEDICINE  Office Visit Note  Patient Name: Mike Nolan  1985  [id number]  Date of Service: 11/25/2017   Complaints/HPI Pt is here for establishment of PCP.  The patient is here to establish care with primary care provider. Today, he is complaining of palpable know in left side of chest. This is tender to palpate. Lying on the floor causing increased tenderness, especially when he goes to change position. This has been present for about 3 months. Since this development, he has also noticed a cough and increased shortness of breath. This is worse at night. Once of his daughters has URI, the other has bronchitis with double ear infection.  He reports no injury mechanism associated with this.  He also has some left knee pain, this is worse when he steps backwards at work. Feels a "grinding" type sensation, but once back on level groud, this resolves.     Current Medication: Outpatient Encounter Medications as of 11/25/2017  Medication Sig  . Ascorbic Acid (VITAMIN C) 1000 MG tablet Take 1,000 mg by mouth daily.  Marland Kitchen. loratadine (CLARITIN) 10 MG tablet Take 10 mg by mouth daily.  Marland Kitchen. azithromycin (ZITHROMAX) 250 MG tablet z-pack - take as directed for 5 days  . [DISCONTINUED] buPROPion (WELLBUTRIN XL) 150 MG 24 hr tablet Take 150 mg by mouth daily.  . [DISCONTINUED] HYDROcodone-acetaminophen (NORCO) 5-325 MG per tablet Take 1 tablet by mouth every 4 (four) hours as needed for pain.  . [DISCONTINUED] meloxicam (MOBIC) 7.5 MG tablet 1 po bid with food  . [DISCONTINUED] methocarbamol (ROBAXIN) 500 MG tablet 2 po tid for spasm  . [DISCONTINUED] Pseudoephedrine-APAP-DM (DAYQUIL PO) Take 2 capsules by mouth every 4 (four) hours as needed. Cold Symptoms   No facility-administered encounter medications on file as of 11/25/2017.     Surgical History: Past Surgical History:  Procedure Laterality Date  . FOOT SURGERY      . NOSE SURGERY    . WISDOM TOOTH EXTRACTION      Medical History: Past Medical History:  Diagnosis Date  . Postural orthostatic tachycardia syndrome     Family History: Family History  Problem Relation Age of Onset  . Hypertension Mother   . Pancreatic cancer Paternal Grandmother     Social History   Socioeconomic History  . Marital status: Married    Spouse name: Not on file  . Number of children: Not on file  . Years of education: Not on file  . Highest education level: Not on file  Social Needs  . Financial resource strain: Not on file  . Food insecurity - worry: Not on file  . Food insecurity - inability: Not on file  . Transportation needs - medical: Not on file  . Transportation needs - non-medical: Not on file  Occupational History  . Not on file  Tobacco Use  . Smoking status: Current Every Day Smoker    Packs/day: 1.00    Types: Cigarettes  . Smokeless tobacco: Current User  Substance and Sexual Activity  . Alcohol use: Yes    Comment: social  . Drug use: Yes    Types: Marijuana  . Sexual activity: Not on file  Other Topics Concern  . Not on file  Social History Narrative  . Not on file     Review of Systems  Constitutional: Negative for activity change, chills, fatigue and unexpected weight change.  HENT: Positive for congestion, postnasal drip and  sinus pressure. Negative for rhinorrhea, sneezing and sore throat.   Eyes: Negative.  Negative for redness.  Respiratory: Positive for cough and shortness of breath. Negative for chest tightness.        Palpable mass at the the base of the sternum.   Cardiovascular: Negative for chest pain and palpitations.  Gastrointestinal: Negative for abdominal pain, constipation, diarrhea, nausea and vomiting.  Endocrine: Negative for cold intolerance, heat intolerance, polydipsia, polyphagia and polyuria.  Genitourinary: Negative.  Negative for dysuria and frequency.  Musculoskeletal: Positive for arthralgias.  Negative for back pain, joint swelling and neck pain.       Left knee pain.  Skin: Negative for rash.  Allergic/Immunologic: Positive for environmental allergies.  Neurological: Negative for tremors, numbness and headaches.  Hematological: Negative for adenopathy. Does not bruise/bleed easily.  Psychiatric/Behavioral: Negative for behavioral problems (Depression), sleep disturbance and suicidal ideas. The patient is not nervous/anxious.     Today's Vitals   11/25/17 1048  BP: 122/80  Pulse: 66  Resp: 16  SpO2: 97%  Weight: 264 lb (119.7 kg)  Height: 6' (1.829 m)    Physical Exam  Constitutional: He is oriented to person, place, and time. He appears well-developed and well-nourished. No distress.  HENT:  Head: Normocephalic and atraumatic.  Right Ear: Tympanic membrane is bulging.  Left Ear: Tympanic membrane is bulging.  Nose: Rhinorrhea present. Right sinus exhibits frontal sinus tenderness. Left sinus exhibits frontal sinus tenderness.  Mouth/Throat: Oropharynx is clear and moist. No oropharyngeal exudate.  Eyes: EOM are normal. Pupils are equal, round, and reactive to light.  Neck: Normal range of motion. Neck supple. No JVD present. No tracheal deviation present. No thyromegaly present.  Cardiovascular: Normal rate, regular rhythm and normal heart sounds. Exam reveals no gallop and no friction rub.  No murmur heard. Pulmonary/Chest: Effort normal. No respiratory distress. He has wheezes in the left lower field. He has no rales. He exhibits tenderness.    Abdominal: Soft. Bowel sounds are normal. There is no tenderness.  Musculoskeletal: Normal range of motion.       Legs: Lymphadenopathy:    He has no cervical adenopathy.  Neurological: He is alert and oriented to person, place, and time. No cranial nerve deficit.  Skin: Skin is warm and dry. He is not diaphoretic.  Psychiatric: He has a normal mood and affect. His behavior is normal. Judgment and thought content normal.    Nursing note and vitals reviewed.  Assessment/Plan:  1. Acute non-recurrent pansinusitis - azithromycin (ZITHROMAX) 250 MG tablet; z-pack - take as directed for 5 days  Dispense: 6 tablet; Refill: 0 Recommend he continue use of claritin d OTC daily to help control symptoms.   2. Left knee pain, unspecified chronicity - DG Knee Complete 4 Views Left; Future Recommend knee brace/ACE bandage to give support to the knee. Will likely refer to orthopedics as indicated.  3. Shortness of breath - DG Chest 2 View; Future  4. Localized superficial mass Likely to be nodular or bony deformity at xyphoid process. Will get chest x-ray for fruther evaluation. Patient is smoker. Discussed smoking cessation with him.    General Counseling: rasheen bells understanding of the findings of todays visit and agrees with plan of treatment. I have discussed any further diagnostic evaluation that may be needed or ordered today. We also reviewed his medications today. he has been encouraged to call the office with any questions or concerns that should arise related to todays visit.   This patient was seen  by Vincent Gros, FNP- C in Collaboration with Dr Lyndon Code as a part of collaborative care agreement    Orders Placed This Encounter  Procedures  . DG Knee Complete 4 Views Left  . DG Chest 2 View    Meds ordered this encounter  Medications  . azithromycin (ZITHROMAX) 250 MG tablet    Sig: z-pack - take as directed for 5 days    Dispense:  6 tablet    Refill:  0    Order Specific Question:   Supervising Provider    Answer:   Lyndon Code [[id number]]    Time spent: 25 Minutes

## 2018-01-07 ENCOUNTER — Ambulatory Visit: Payer: 59 | Admitting: Nurse Practitioner

## 2018-01-07 ENCOUNTER — Encounter: Payer: Self-pay | Admitting: Nurse Practitioner

## 2018-01-07 VITALS — BP 139/90 | HR 103 | Resp 16 | Ht 72.0 in | Wt 260.4 lb

## 2018-01-07 DIAGNOSIS — R229 Localized swelling, mass and lump, unspecified: Secondary | ICD-10-CM | POA: Diagnosis not present

## 2018-01-07 DIAGNOSIS — M25562 Pain in left knee: Secondary | ICD-10-CM | POA: Diagnosis not present

## 2018-01-07 DIAGNOSIS — E663 Overweight: Secondary | ICD-10-CM | POA: Diagnosis not present

## 2018-01-07 DIAGNOSIS — Z0001 Encounter for general adult medical examination with abnormal findings: Secondary | ICD-10-CM | POA: Diagnosis not present

## 2018-01-07 DIAGNOSIS — G8929 Other chronic pain: Secondary | ICD-10-CM

## 2018-01-07 NOTE — Progress Notes (Signed)
[facility] [address] [place], [place] [address]  Internal MEDICINE  Office Visit Note  Patient Name: Mike Nolan  [id number]  [id number]  Date of Service: 01/07/2018  Chief Complaint  Patient presents with  . Knee Pain    left - normal x-ray     The patient is here for follow up visit. Continues to have pain in left knee. This is worse with exertion. Job is strenuous, and some days, the pain is worse than others. X-ray, done since his last visit was negative for acute bony abnormalities. It was also negative for soft tissue abnormalities.  Has palpable mass at the base of the sternum, lower chest. This is tender when he lays on his stomach or participates in physical activity. Chest x-ray did appreciate the mass, subcutaneous in nature. Suggessted to have ultrasound of the area for further evaluation.    Pt is here for routine follow up.    Current Medication: Outpatient Encounter Medications as of 01/07/2018  Medication Sig  . Ascorbic Acid (VITAMIN C) 1000 MG tablet Take 1,000 mg by mouth daily.  Marland Kitchen azithromycin (ZITHROMAX) 250 MG tablet z-pack - take as directed for 5 days (Patient not taking: Reported on 01/07/2018)  . loratadine (CLARITIN) 10 MG tablet Take 10 mg by mouth daily.   No facility-administered encounter medications on file as of 01/07/2018.     Surgical History: Past Surgical History:  Procedure Laterality Date  . FOOT SURGERY    . NOSE SURGERY    . WISDOM TOOTH EXTRACTION      Medical History: Past Medical History:  Diagnosis Date  . Allergy   . Postural orthostatic tachycardia syndrome     Family History: Family History  Problem Relation Age of Onset  . Hypertension Mother   . Pancreatic cancer Paternal Grandmother     Social History   Socioeconomic History  . Marital status: Married    Spouse name: Not on file  . Number of children: Not on file  . Years of education: Not on file  . Highest education level: Not on file    Occupational History  . Not on file  Social Needs  . Financial resource strain: Not on file  . Food insecurity:    Worry: Not on file    Inability: Not on file  . Transportation needs:    Medical: Not on file    Non-medical: Not on file  Tobacco Use  . Smoking status: Current Every Day Smoker    Packs/day: 1.00    Types: Cigarettes  . Smokeless tobacco: Current User  Substance and Sexual Activity  . Alcohol use: Yes    Comment: social  . Drug use: Not Currently  . Sexual activity: Not on file  Lifestyle  . Physical activity:    Days per week: Not on file    Minutes per session: Not on file  . Stress: Not on file  Relationships  . Social connections:    Talks on phone: Not on file    Gets together: Not on file    Attends religious service: Not on file    Active member of club or organization: Not on file    Attends meetings of clubs or organizations: Not on file    Relationship status: Not on file  . Intimate partner violence:    Fear of current or ex partner: Not on file    Emotionally abused: Not on file    Physically abused: Not on file  Forced sexual activity: Not on file  Other Topics Concern  . Not on file  Social History Narrative  . Not on file      Review of Systems  Constitutional: Negative for activity change, chills, fatigue and unexpected weight change.  HENT: Negative for congestion, postnasal drip, rhinorrhea, sinus pressure, sneezing and sore throat.   Eyes: Negative.  Negative for redness.  Respiratory: Negative for cough, chest tightness and shortness of breath.        Palpable mass at the the base of the sternum.   Cardiovascular: Negative for chest pain and palpitations.  Gastrointestinal: Negative for abdominal pain, constipation, diarrhea, nausea and vomiting.  Endocrine: Negative for cold intolerance, heat intolerance, polydipsia, polyphagia and polyuria.  Genitourinary: Negative.  Negative for dysuria and frequency.  Musculoskeletal:  Positive for arthralgias. Negative for back pain, joint swelling and neck pain.       Left knee pain.  Skin: Negative for rash.  Allergic/Immunologic: Positive for environmental allergies.  Neurological: Negative for tremors, numbness and headaches.  Hematological: Negative for adenopathy. Does not bruise/bleed easily.  Psychiatric/Behavioral: Negative for behavioral problems (Depression), sleep disturbance and suicidal ideas. The patient is not nervous/anxious.     Vital Signs: BP 139/90 (BP Location: Right Arm, Patient Position: Sitting, Cuff Size: Normal)   Pulse (!) 103   Resp 16   Ht 6' (1.829 m)   Wt 260 lb 6.4 oz (118.1 kg)   SpO2 98%   BMI 35.32 kg/m    Physical Exam  Constitutional: He is oriented to person, place, and time. He appears well-developed and well-nourished. No distress.  HENT:  Head: Normocephalic and atraumatic.  Right Ear: Tympanic membrane is not bulging.  Left Ear: Tympanic membrane is not bulging.  Nose: No rhinorrhea. Right sinus exhibits no frontal sinus tenderness. Left sinus exhibits no frontal sinus tenderness.  Mouth/Throat: Oropharynx is clear and moist. No oropharyngeal exudate.  Eyes: Pupils are equal, round, and reactive to light. EOM are normal.  Neck: Normal range of motion. Neck supple. No JVD present. No tracheal deviation present. No thyromegaly present.  Cardiovascular: Normal rate, regular rhythm and normal heart sounds. Exam reveals no gallop and no friction rub.  No murmur heard. Pulmonary/Chest: Effort normal. No respiratory distress. He has no wheezes. He has no rales. He exhibits no tenderness.    Abdominal: Soft. Bowel sounds are normal. There is no tenderness.  Musculoskeletal: Normal range of motion.       Legs: Lymphadenopathy:    He has no cervical adenopathy.  Neurological: He is alert and oriented to person, place, and time. No cranial nerve deficit.  Skin: Skin is warm and dry. He is not diaphoretic.  Psychiatric: He  has a normal mood and affect. His behavior is normal. Judgment and thought content normal.  Nursing note and vitals reviewed.  Assessment/Plan: 1. Chronic pain of left knee Negative x-ray. Refer to orthopedics for further evaluation and treatment.  - Ambulatory referral to Orthopedic Surgery  2. Localized superficial mass Chest x-ray showing subcutaneous mass. Will get ultrasound of the area for further evaluation. Discuss results at next visit.  - US CHEST SOFT TISSUE; Future  3. Overweight Check labs.  - Lipid panel - TSH  4. Encounter for general adult medical examination with abnormal findings Check labs for routine monitoring.  - CBC with Differential/Platelet - Comprehensive metabolic panel - Lipid panel - TSH  General Counseling: Arlys JohnBrian verbalizes understanding of the findings of todays visit and agrees with plan of treatment.  I have discussed any further diagnostic evaluation that may be needed or ordered today. We also reviewed his medications today. he has been encouraged to call the office with any questions or concerns that should arise related to todays visit.  This patient was seen by Vincent Gros, FNP- C in Collaboration with Dr Lyndon Code as a part of collaborative care agreement    Orders Placed This Encounter  Procedures  . [place] CHEST SOFT TISSUE  . CBC with Differential/Platelet  . Comprehensive metabolic panel  . Lipid panel  . TSH  . Ambulatory referral to Orthopedic Surgery     Time spent: 15 Minutes   Dr Lyndon Code Internal medicine

## 2018-01-08 ENCOUNTER — Telehealth: Payer: Self-pay

## 2018-01-08 ENCOUNTER — Other Ambulatory Visit: Payer: Self-pay | Admitting: Nurse Practitioner

## 2018-01-08 ENCOUNTER — Telehealth: Payer: Self-pay | Admitting: Nurse Practitioner

## 2018-01-08 DIAGNOSIS — J0140 Acute pansinusitis, unspecified: Secondary | ICD-10-CM

## 2018-01-08 DIAGNOSIS — E782 Mixed hyperlipidemia: Secondary | ICD-10-CM

## 2018-01-08 LAB — CBC WITH DIFFERENTIAL/PLATELET
BASOS ABS: 0.1 10*3/uL (ref 0.0–0.2)
BASOS: 1 %
EOS (ABSOLUTE): 0.2 10*3/uL (ref 0.0–0.4)
Eos: 2 %
Hematocrit: 50 % (ref 37.5–51.0)
Hemoglobin: 17.7 g/dL (ref 13.0–17.7)
IMMATURE GRANULOCYTES: 0 %
Immature Grans (Abs): 0 10*3/uL (ref 0.0–0.1)
LYMPHS: 21 %
Lymphocytes Absolute: 2.5 10*3/uL (ref 0.7–3.1)
MCH: 30.4 pg (ref 26.6–33.0)
MCHC: 35.4 g/dL (ref 31.5–35.7)
MCV: 86 fL (ref 79–97)
MONOS ABS: 1.1 10*3/uL — AB (ref 0.1–0.9)
Monocytes: 9 %
NEUTROS PCT: 67 %
Neutrophils Absolute: 8 10*3/uL — ABNORMAL HIGH (ref 1.4–7.0)
PLATELETS: 286 10*3/uL (ref 150–379)
RBC: 5.83 x10E6/uL — AB (ref 4.14–5.80)
RDW: 12.9 % (ref 12.3–15.4)
WBC: 11.9 10*3/uL — AB (ref 3.4–10.8)

## 2018-01-08 LAB — LIPID PANEL
CHOL/HDL RATIO: 4.8 ratio (ref 0.0–5.0)
Cholesterol, Total: 173 mg/dL (ref 100–199)
HDL: 36 mg/dL — AB (ref >39)
TRIGLYCERIDES: 457 mg/dL — AB (ref 0–149)

## 2018-01-08 LAB — COMPREHENSIVE METABOLIC PANEL
ALT: 41 IU/L (ref 0–44)
AST: 26 IU/L (ref 0–40)
Albumin/Globulin Ratio: 1.8 (ref 1.2–2.2)
Albumin: 4.9 g/dL (ref 3.5–5.5)
Alkaline Phosphatase: 70 IU/L (ref 39–117)
BUN/Creatinine Ratio: 13 (ref 9–20)
BUN: 12 mg/dL (ref 6–20)
Bilirubin Total: 0.5 mg/dL (ref 0.0–1.2)
CALCIUM: 10 mg/dL (ref 8.7–10.2)
CO2: 25 mmol/L (ref 20–29)
CREATININE: 0.9 mg/dL (ref 0.76–1.27)
Chloride: 100 mmol/L (ref 96–106)
GFR, EST AFRICAN AMERICAN: 132 mL/min/{1.73_m2} (ref >59)
GFR, EST NON AFRICAN AMERICAN: 114 mL/min/{1.73_m2} (ref >59)
GLUCOSE: 96 mg/dL (ref 65–99)
Globulin, Total: 2.7 g/dL (ref 1.5–4.5)
POTASSIUM: 4.7 mmol/L (ref 3.5–5.2)
Sodium: 140 mmol/L (ref 134–144)
TOTAL PROTEIN: 7.6 g/dL (ref 6.0–8.5)

## 2018-01-08 LAB — TSH: TSH: 1.07 u[IU]/mL (ref 0.450–4.500)

## 2018-01-08 MED ORDER — AMOXICILLIN-POT CLAVULANATE 875-125 MG PO TABS: 1.0000 | ORAL_TABLET | Freq: Two times a day (BID) | ORAL | 0 refills | Status: DC

## 2018-01-08 MED ORDER — ROSUVASTATIN CALCIUM 10 MG PO TABS: 10.0000 mg | ORAL_TABLET | Freq: Every day | ORAL | 3 refills | Status: DC

## 2018-01-08 NOTE — Telephone Encounter (Signed)
Per patient will wait and have done with ortho

## 2018-01-08 NOTE — Progress Notes (Signed)
Labs sowing mild elevation of WBC, possible leftover sinus infection. Will treat with augmentin 875mg  twice daily for 10 days. Retest levels at next visit.

## 2018-01-08 NOTE — Telephone Encounter (Signed)
I think what would be best is to refer to orthopedics, that way they can get this ordered, if this is available.

## 2018-01-08 NOTE — Progress Notes (Signed)
Labs also showing very elevated cholesterol/triglyeride levels. Added crestor 10mg  every evening. Will retest at next visit.

## 2018-01-08 NOTE — Telephone Encounter (Signed)
Patient has been advised that Herbert SetaHeather thinks what would be best is to refer to orthopedics, that way they can get this ordered, if this is available (whole body scan).  I will cancel appointment for his ultrasound chest per patients request.  *Patient would like to do whole body scan vs  3 separate procedures*. [place]

## 2018-01-10 NOTE — Progress Notes (Signed)
Patient called and was notified of his results and new rx.

## 2018-01-16 ENCOUNTER — Other Ambulatory Visit: Payer: 59

## 2018-02-18 ENCOUNTER — Ambulatory Visit: Payer: Self-pay | Admitting: Nurse Practitioner

## 2018-03-14 ENCOUNTER — Other Ambulatory Visit: Payer: Self-pay

## 2018-03-18 ENCOUNTER — Ambulatory Visit: Payer: Self-pay | Admitting: Nurse Practitioner

## 2018-04-25 ENCOUNTER — Encounter (INDEPENDENT_AMBULATORY_CARE_PROVIDER_SITE_OTHER): Payer: Self-pay

## 2018-04-25 ENCOUNTER — Ambulatory Visit: Payer: 59

## 2018-04-25 DIAGNOSIS — R229 Localized swelling, mass and lump, unspecified: Secondary | ICD-10-CM

## 2018-05-02 ENCOUNTER — Encounter: Payer: Self-pay | Admitting: Nurse Practitioner

## 2018-05-02 ENCOUNTER — Ambulatory Visit: Payer: 59 | Admitting: Nurse Practitioner

## 2018-05-02 VITALS — BP 128/77 | HR 72 | Resp 16 | Ht 72.0 in | Wt 249.0 lb

## 2018-05-02 DIAGNOSIS — M25571 Pain in right ankle and joints of right foot: Secondary | ICD-10-CM | POA: Diagnosis not present

## 2018-05-02 DIAGNOSIS — R229 Localized swelling, mass and lump, unspecified: Secondary | ICD-10-CM | POA: Diagnosis not present

## 2018-05-02 DIAGNOSIS — F411 Generalized anxiety disorder: Secondary | ICD-10-CM | POA: Diagnosis not present

## 2018-05-02 MED ORDER — BUSPIRONE HCL 10 MG PO TABS
ORAL_TABLET | ORAL | 3 refills | Status: DC
Start: 2018-05-02 — End: 2019-09-28

## 2018-05-02 NOTE — Progress Notes (Signed)
[facility] [address] [place], [place] [address]  Internal MEDICINE  Office Visit Note  Patient Name: Mike Nolan  [id number]  [id number]  Date of Service: 05/02/2018  Chief Complaint  Patient presents with  . Results    review ultrasound  . other    right ankle    The patient has palpable knot on left side of the chest. X-ray did show soft tissue swelling. Had ultrasound done prior to this visit, but no focal abnormality was located. He states that the mass is tender. Prevents him from laying on his belly. Does get inflamed and sore if palpated . Right ankle pain. Started Monday. Jumped over a fence and landed wrong on his ankle. It twisted. Is swollen and tender. He is able to walk on it without issue. Has iced it and worn brace on it.       Current Medication: Outpatient Encounter Medications as of 05/02/2018  Medication Sig  . amoxicillin-clavulanate (AUGMENTIN) 875-125 MG tablet Take 1 tablet by mouth 2 (two) times daily.  . Ascorbic Acid (VITAMIN C) 1000 MG tablet Take 1,000 mg by mouth daily.  Marland Kitchen loratadine (CLARITIN) 10 MG tablet Take 10 mg by mouth daily.  . rosuvastatin (CRESTOR) 10 MG tablet Take 1 tablet (10 mg total) by mouth daily.  Marland Kitchen azithromycin (ZITHROMAX) 250 MG tablet z-pack - take as directed for 5 days (Patient not taking: Reported on 01/07/2018)  . busPIRone (BUSPAR) 10 MG tablet Take 1/2 to 1 tablet po BID prn anxiety   No facility-administered encounter medications on file as of 05/02/2018.     Surgical History: Past Surgical History:  Procedure Laterality Date  . FOOT SURGERY    . NOSE SURGERY    . WISDOM TOOTH EXTRACTION      Medical History: Past Medical History:  Diagnosis Date  . Allergy   . Postural orthostatic tachycardia syndrome     Family History: Family History  Problem Relation Age of Onset  . Hypertension Mother   . Pancreatic cancer Paternal Grandmother     Social History   Socioeconomic History  .  Marital status: Married    Spouse name: Not on file  . Number of children: Not on file  . Years of education: Not on file  . Highest education level: Not on file  Occupational History  . Not on file  Social Needs  . Financial resource strain: Not on file  . Food insecurity:    Worry: Not on file    Inability: Not on file  . Transportation needs:    Medical: Not on file    Non-medical: Not on file  Tobacco Use  . Smoking status: Current Every Day Smoker    Packs/day: 1.00    Types: Cigarettes  . Smokeless tobacco: Current User  Substance and Sexual Activity  . Alcohol use: Yes    Comment: social  . Drug use: Not Currently  . Sexual activity: Not on file  Lifestyle  . Physical activity:    Days per week: Not on file    Minutes per session: Not on file  . Stress: Not on file  Relationships  . Social connections:    Talks on phone: Not on file    Gets together: Not on file    Attends religious service: Not on file    Active member of club or organization: Not on file    Attends meetings of clubs or organizations: Not on file    Relationship status:  Not on file  . Intimate partner violence:    Fear of current or ex partner: Not on file    Emotionally abused: Not on file    Physically abused: Not on file    Forced sexual activity: Not on file  Other Topics Concern  . Not on file  Social History Narrative  . Not on file      Review of Systems  Constitutional: Negative for activity change, chills, fatigue and unexpected weight change.  HENT: Negative for congestion, postnasal drip, rhinorrhea, sinus pressure, sneezing and sore throat.   Eyes: Negative.  Negative for redness.  Respiratory: Negative for cough, chest tightness and shortness of breath.        Palpable mass at the the base of the sternum.   Cardiovascular: Negative for chest pain and palpitations.  Gastrointestinal: Negative for abdominal pain, constipation, diarrhea, nausea and vomiting.  Endocrine:  Negative for cold intolerance, heat intolerance, polydipsia, polyphagia and polyuria.  Genitourinary: Negative.  Negative for dysuria and frequency.  Musculoskeletal: Positive for arthralgias. Negative for back pain, joint swelling and neck pain.       Right ankle pain after falling and twisting his ankle this past Monday.   Skin: Negative for rash.  Allergic/Immunologic: Positive for environmental allergies.  Neurological: Negative for dizziness, tremors, numbness and headaches.  Hematological: Negative for adenopathy. Does not bruise/bleed easily.  Psychiatric/Behavioral: Negative for behavioral problems (Depression), sleep disturbance and suicidal ideas. The patient is nervous/anxious.     Today's Vitals   05/02/18 0924  BP: 128/77  Pulse: 72  Resp: 16  SpO2: 95%  Weight: 249 lb (112.9 kg)  Height: 6' (1.829 m)    Physical Exam  Constitutional: He is oriented to person, place, and time. He appears well-developed and well-nourished. No distress.  HENT:  Head: Normocephalic and atraumatic.  Mouth/Throat: Oropharynx is clear and moist. No oropharyngeal exudate.  Eyes: Pupils are equal, round, and reactive to light. Conjunctivae and EOM are normal.  Neck: Normal range of motion. Neck supple. No JVD present. No tracheal deviation present. No thyromegaly present.  Cardiovascular: Normal rate, regular rhythm and normal heart sounds. Exam reveals no gallop and no friction rub.  No murmur heard. Pulmonary/Chest: Effort normal and breath sounds normal. No respiratory distress. He has no wheezes. He has no rales. He exhibits tenderness.  Bony or soft tissue prominence at the base of the sternum. Mild tenderness with deep palpation. No redness, bruising, or swelling noted. Essentially unchanged since last visit.     Abdominal: Soft. Bowel sounds are normal. There is no tenderness.  Musculoskeletal: Normal range of motion.       Feet:  Lymphadenopathy:    He has no cervical adenopathy.    Neurological: He is alert and oriented to person, place, and time. No cranial nerve deficit.  Skin: Skin is warm and dry. Capillary refill takes less than 2 seconds. He is not diaphoretic.  Psychiatric: His behavior is normal. Judgment and thought content normal. His mood appears anxious.  Nursing note and vitals reviewed.  Assessment/Plan:  1. Localized superficial mass Reviewed chest x-ray, showing subcutaneous prominence at area of concern. Ultrasound was negative for abnormality. Will refer to orthopedics for further evaluation.  - Ambulatory referral to Orthopedic Surgery  2. Acute right ankle pain Apply a compressive ACE bandage. Rest and elevate the affected painful area.  Apply cold compresses intermittently as needed.  As pain recedes, begin normal activities slowly as tolerated.  Will get x-ray of right ankle  for further evaluation.  - DG Ankle Complete Right; Future  3. Generalized anxiety disorder Start buspirone 10mg . May take 1/2 t o 1 tablet twice daily if needed for acute anxiety.  - busPIRone (BUSPAR) 10 MG tablet; Take 1/2 to 1 tablet po BID prn anxiety  Dispense: 60 tablet; Refill: 3    General Counseling: Arlys JohnBrian verbalizes understanding of the findings of todays visit and agrees with plan of treatment. I have discussed any further diagnostic evaluation that may be needed or ordered today. We also reviewed his medications today. he has been encouraged to call the office with any questions or concerns that should arise related to todays visit.  This patient was seen by Vincent GrosHeather Yonael Tulloch FNP Collaboration with Dr Lyndon CodeFozia M Khan as a part of collaborative care agreement  Orders Placed This Encounter  Procedures  . DG Ankle Complete Right  . Ambulatory referral to Orthopedic Surgery    Meds ordered this encounter  Medications  . busPIRone (BUSPAR) 10 MG tablet    Sig: Take 1/2 to 1 tablet po BID prn anxiety    Dispense:  60 tablet    Refill:  3    Order Specific  Question:   Supervising Provider    Answer:   Lyndon CodeKHAN, FOZIA M [[id number]]    Time spent: 2720 Minutes      Dr Lyndon CodeFozia M Khan Internal medicine

## 2018-05-09 DIAGNOSIS — R0789 Other chest pain: Secondary | ICD-10-CM | POA: Insufficient documentation

## 2018-08-12 ENCOUNTER — Encounter: Payer: Self-pay | Admitting: Nurse Practitioner

## 2018-08-12 ENCOUNTER — Ambulatory Visit: Payer: BLUE CROSS/BLUE SHIELD | Admitting: Nurse Practitioner

## 2018-08-12 VITALS — BP 128/80 | HR 76 | Resp 16 | Ht 72.0 in | Wt 256.0 lb

## 2018-08-12 DIAGNOSIS — E782 Mixed hyperlipidemia: Secondary | ICD-10-CM

## 2018-08-12 DIAGNOSIS — F411 Generalized anxiety disorder: Secondary | ICD-10-CM | POA: Diagnosis not present

## 2018-08-12 DIAGNOSIS — M25571 Pain in right ankle and joints of right foot: Secondary | ICD-10-CM

## 2018-08-12 NOTE — Progress Notes (Signed)
[facility] [address] [place], [place] [address]  Internal MEDICINE  Office Visit Note  Patient Name: ARIAS WEINERT  [id number]  [id number]  Date of Service: 08/12/2018  Chief Complaint  Patient presents with  . Allergies    The patient was started on buspirone 10mg  as needed to help with anxiety and irritability. He states that he takes this "hot or miss." when he takes it, it is mostly at night does help to relax him. It makes him less irritable and helps him to sleep better.  Still has some swelling in his right ankle. Discussed this with me at his last visit. Did suffer a fall, twisting the ankle. An x-ray had been ordered, but he has not done it. ROM and strength is intact. Does not walk with limp. Is able to tolerate regular activity without difficulty. He has seen orthopedics for palpable mass in the left chest. They have done MRI of the area and found no abnormalities .      Current Medication: Outpatient Encounter Medications as of 08/12/2018  Medication Sig  . Ascorbic Acid (VITAMIN C) 1000 MG tablet Take 1,000 mg by mouth daily.  . busPIRone (BUSPAR) 10 MG tablet Take 1/2 to 1 tablet po BID prn anxiety  . loratadine (CLARITIN) 10 MG tablet Take 10 mg by mouth daily.  . rosuvastatin (CRESTOR) 10 MG tablet Take 1 tablet (10 mg total) by mouth daily.  . [DISCONTINUED] amoxicillin-clavulanate (AUGMENTIN) 875-125 MG tablet Take 1 tablet by mouth 2 (two) times daily. (Patient not taking: Reported on 08/12/2018)  . [DISCONTINUED] azithromycin (ZITHROMAX) 250 MG tablet z-pack - take as directed for 5 days (Patient not taking: Reported on 01/07/2018)   No facility-administered encounter medications on file as of 08/12/2018.     Surgical History: Past Surgical History:  Procedure Laterality Date  . FOOT SURGERY    . NOSE SURGERY    . WISDOM TOOTH EXTRACTION      Medical History: Past Medical History:  Diagnosis Date  . Allergy   . Postural orthostatic  tachycardia syndrome     Family History: Family History  Problem Relation Age of Onset  . Hypertension Mother   . Pancreatic cancer Paternal Grandmother     Social History   Socioeconomic History  . Marital status: Married    Spouse name: Not on file  . Number of children: Not on file  . Years of education: Not on file  . Highest education level: Not on file  Occupational History  . Not on file  Social Needs  . Financial resource strain: Not on file  . Food insecurity:    Worry: Not on file    Inability: Not on file  . Transportation needs:    Medical: Not on file    Non-medical: Not on file  Tobacco Use  . Smoking status: Current Every Day Smoker    Packs/day: 1.00    Types: Cigarettes  . Smokeless tobacco: Current User  Substance and Sexual Activity  . Alcohol use: Yes    Comment: social  . Drug use: Not Currently  . Sexual activity: Not on file  Lifestyle  . Physical activity:    Days per week: Not on file    Minutes per session: Not on file  . Stress: Not on file  Relationships  . Social connections:    Talks on phone: Not on file    Gets together: Not on file    Attends religious service: Not on file  Active member of club or organization: Not on file    Attends meetings of clubs or organizations: Not on file    Relationship status: Not on file  . Intimate partner violence:    Fear of current or ex partner: Not on file    Emotionally abused: Not on file    Physically abused: Not on file    Forced sexual activity: Not on file  Other Topics Concern  . Not on file  Social History Narrative  . Not on file      Review of Systems  Constitutional: Negative for activity change, chills, fatigue and unexpected weight change.  HENT: Negative for congestion, postnasal drip, rhinorrhea, sinus pressure, sneezing and sore throat.   Eyes: Negative.  Negative for redness.  Respiratory: Negative for cough, chest tightness and shortness of breath.    Cardiovascular: Negative for chest pain and palpitations.  Gastrointestinal: Negative for abdominal pain, constipation, diarrhea, nausea and vomiting.  Endocrine: Negative for cold intolerance, heat intolerance, polydipsia, polyphagia and polyuria.  Genitourinary: Negative.  Negative for dysuria and frequency.  Musculoskeletal: Positive for arthralgias. Negative for back pain, joint swelling and neck pain.       Right ankle pain after falling and twisting his ankle this past Monday.   Skin: Negative for rash.  Allergic/Immunologic: Positive for environmental allergies.  Neurological: Negative for dizziness, tremors, numbness and headaches.  Hematological: Negative for adenopathy. Does not bruise/bleed easily.  Psychiatric/Behavioral: Negative for behavioral problems (Depression), sleep disturbance and suicidal ideas. The patient is nervous/anxious.        Improved with adding buspirone as needed     Today's Vitals   08/12/18 0827  BP: 128/80  Pulse: 76  Resp: 16  SpO2: 98%  Weight: 256 lb (116.1 kg)  Height: 6' (1.829 m)    Physical Exam  Constitutional: He is oriented to person, place, and time. He appears well-developed and well-nourished. No distress.  HENT:  Head: Normocephalic and atraumatic.  Mouth/Throat: Oropharynx is clear and moist. No oropharyngeal exudate.  Eyes: Pupils are equal, round, and reactive to light. EOM are normal.  Neck: Normal range of motion. Neck supple. No JVD present. No tracheal deviation present. No thyromegaly present.  Cardiovascular: Normal rate, regular rhythm and normal heart sounds. Exam reveals no gallop and no friction rub.  No murmur heard. Pulmonary/Chest: Effort normal and breath sounds normal. No respiratory distress. He has no wheezes. He has no rales. He exhibits no tenderness.  Abdominal: Soft. Bowel sounds are normal.  Musculoskeletal: Normal range of motion.  There is very mild swelling along lateral aspect of the right ankle. Not  tender to palpate. ROM and strength are intact .  Lymphadenopathy:    He has no cervical adenopathy.  Neurological: He is alert and oriented to person, place, and time. No cranial nerve deficit.  Skin: Skin is warm and dry. He is not diaphoretic.  Psychiatric: He has a normal mood and affect. His behavior is normal. Judgment and thought content normal.  Nursing note and vitals reviewed.  Assessment/Plan: 1. Acute right ankle pain Improving. Only mild swelling present along lateral aspect of the left ankle. ROM and strength are intact. Will monitor.   2. Generalized anxiety disorder Improved. Continue to use buspirone 10mg  twice daily as needed for acute anxiety   3. Mixed hyperlipidemia Take crestor as prescribed and continue with reported dietary and lifestyle changes. Will recheck lipid panel after next visit .  General Counseling: kayshaun polanco understanding of the findings of  todays visit and agrees with plan of treatment. I have discussed any further diagnostic evaluation that may be needed or ordered today. We also reviewed his medications today. he has been encouraged to call the office with any questions or concerns that should arise related to todays visit.  This patient was seen by Vincent Gros FNP Collaboration with Dr Lyndon Code as a part of collaborative care agreement  Time spent: 15 Minutes      Dr Lyndon Code Internal medicine

## 2019-02-12 ENCOUNTER — Encounter: Payer: Self-pay | Admitting: Nurse Practitioner

## 2019-02-12 ENCOUNTER — Ambulatory Visit: Payer: BLUE CROSS/BLUE SHIELD | Admitting: Nurse Practitioner

## 2019-02-12 ENCOUNTER — Telehealth: Payer: Self-pay

## 2019-02-12 ENCOUNTER — Other Ambulatory Visit: Payer: Self-pay

## 2019-02-12 DIAGNOSIS — F411 Generalized anxiety disorder: Secondary | ICD-10-CM

## 2019-02-12 DIAGNOSIS — M25571 Pain in right ankle and joints of right foot: Secondary | ICD-10-CM

## 2019-02-12 DIAGNOSIS — E782 Mixed hyperlipidemia: Secondary | ICD-10-CM

## 2019-02-12 NOTE — Progress Notes (Signed)
[facility] [address] [place], [place] [address]  Internal MEDICINE  Telephone Visit  Patient Name: Mike Nolan  [id number]  [id number]  Date of Service: 02/12/2019  I connected with the patient at 8:52am by webcam and verified the patients identity using two identifiers.   I discussed the limitations, risks, security and privacy concerns of performing an evaluation and management service by webcam and the availability of in person appointments. I also discussed with the patient that there may be a patient responsible charge related to the service.  The patient expressed understanding and agrees to proceed.    Chief Complaint  Patient presents with  . Telephone Assessment  . Telephone Screen  . Follow-up    The patient has been contacted via webcam for follow up visit due to concerns for spread of novel coronavirus. The patient was started on buspirone 10mg  as needed to help with anxiety and irritability. He states that he takes this intermittently. He takes it, it is mostly at night does help to relax him. It makes him less irritable and helps him to sleep better.  He was also started on low dose cholesterol medication, which he never did start taking. Instead, he made several diet changes and is exercising more frequently . He would like to recheck his cholesterol at his next visit before starting on medication for this.       Current Medication: Outpatient Encounter Medications as of 02/12/2019  Medication Sig  . Ascorbic Acid (VITAMIN C) 1000 MG tablet Take 1,000 mg by mouth daily.  Marland Kitchen loratadine (CLARITIN) 10 MG tablet Take 10 mg by mouth daily.  . busPIRone (BUSPAR) 10 MG tablet Take 1/2 to 1 tablet po BID prn anxiety (Patient not taking: Reported on 02/12/2019)  . rosuvastatin (CRESTOR) 10 MG tablet Take 1 tablet (10 mg total) by mouth daily. (Patient not taking: Reported on 02/12/2019)   No facility-administered encounter medications on file as of 02/12/2019.      Surgical History: Past Surgical History:  Procedure Laterality Date  . FOOT SURGERY    . NOSE SURGERY    . WISDOM TOOTH EXTRACTION      Medical History: Past Medical History:  Diagnosis Date  . Allergy   . Postural orthostatic tachycardia syndrome     Family History: Family History  Problem Relation Age of Onset  . Hypertension Mother   . Pancreatic cancer Paternal Grandmother     Social History   Socioeconomic History  . Marital status: Married    Spouse name: Not on file  . Number of children: Not on file  . Years of education: Not on file  . Highest education level: Not on file  Occupational History  . Not on file  Social Needs  . Financial resource strain: Not on file  . Food insecurity:    Worry: Not on file    Inability: Not on file  . Transportation needs:    Medical: Not on file    Non-medical: Not on file  Tobacco Use  . Smoking status: Current Every Day Smoker    Packs/day: 1.00    Types: Cigarettes  . Smokeless tobacco: Current User  Substance and Sexual Activity  . Alcohol use: Yes    Comment: social  . Drug use: Not Currently  . Sexual activity: Not on file  Lifestyle  . Physical activity:    Days per week: Not on file    Minutes per session: Not on file  . Stress: Not  on file  Relationships  . Social connections:    Talks on phone: Not on file    Gets together: Not on file    Attends religious service: Not on file    Active member of club or organization: Not on file    Attends meetings of clubs or organizations: Not on file    Relationship status: Not on file  . Intimate partner violence:    Fear of current or ex partner: Not on file    Emotionally abused: Not on file    Physically abused: Not on file    Forced sexual activity: Not on file  Other Topics Concern  . Not on file  Social History Narrative  . Not on file      Review of Systems  Constitutional: Negative for activity change, chills, fatigue and unexpected  weight change.  HENT: Negative for congestion, postnasal drip, rhinorrhea, sinus pressure, sneezing and sore throat.   Eyes: Negative.  Negative for redness.  Respiratory: Negative for cough, chest tightness and shortness of breath.   Cardiovascular: Negative for chest pain and palpitations.  Gastrointestinal: Negative for abdominal pain, constipation, diarrhea, nausea and vomiting.  Endocrine: Negative for cold intolerance, heat intolerance, polydipsia, polyphagia and polyuria.  Musculoskeletal: Positive for arthralgias. Negative for back pain, joint swelling and neck pain.       Still has some right ankle pain, but it continues to gradually get better.    Skin: Negative for rash.  Allergic/Immunologic: Positive for environmental allergies.  Neurological: Negative for dizziness, tremors, numbness and headaches.  Hematological: Negative for adenopathy. Does not bruise/bleed easily.  Psychiatric/Behavioral: Negative for behavioral problems (Depression), sleep disturbance and suicidal ideas. The patient is nervous/anxious.        Improved with adding buspirone as needed     Vital Signs: There were no vitals taken for this visit.   Observation/Objective:  The patient is alert and oriented. He is pleasant and answering all questions appropriately. He is in no acute distress.    Assessment/Plan:  1. Mixed hyperlipidemia Patient not taking medication, but has made diet and lifestyle changes. Will recheck cholesterol panel at next visit and discuss need for medication in future.   2. Generalized anxiety disorder Well managed. Continue to take buspirone as needed and as prescribed   3. Acute right ankle pain Continue to monitor.   General Counseling: Mike Nolan verbalizes understanding of the findings of today's phone visit and agrees with plan of treatment. I have discussed any further diagnostic evaluation that may be needed or ordered today. We also reviewed his medications today. he has  been encouraged to call the office with any questions or concerns that should arise related to todays visit.  This patient was seen by Vincent GrosHeather Lydia Toren FNP Collaboration with Dr Lyndon CodeFozia M Khan as a part of collaborative care agreement   Time spent: 15 Minutes    Dr Lyndon CodeFozia M Khan Internal medicine

## 2019-02-12 NOTE — Telephone Encounter (Signed)
Mailed labslip 

## 2019-03-18 IMAGING — CR DG KNEE COMPLETE 4+V*L*
1 series · 4 of 4 positions shown · non-contrast
Comparison: Radiographs April 17, 2012.

CLINICAL DATA: Chronic left knee pain without known injury.

EXAM:
LEFT KNEE - COMPLETE 4+ VIEW

[Series 1: dg knee complete 4 views left · 0.14mm/px · 4 of 4 slices shown]
[im 1/4]
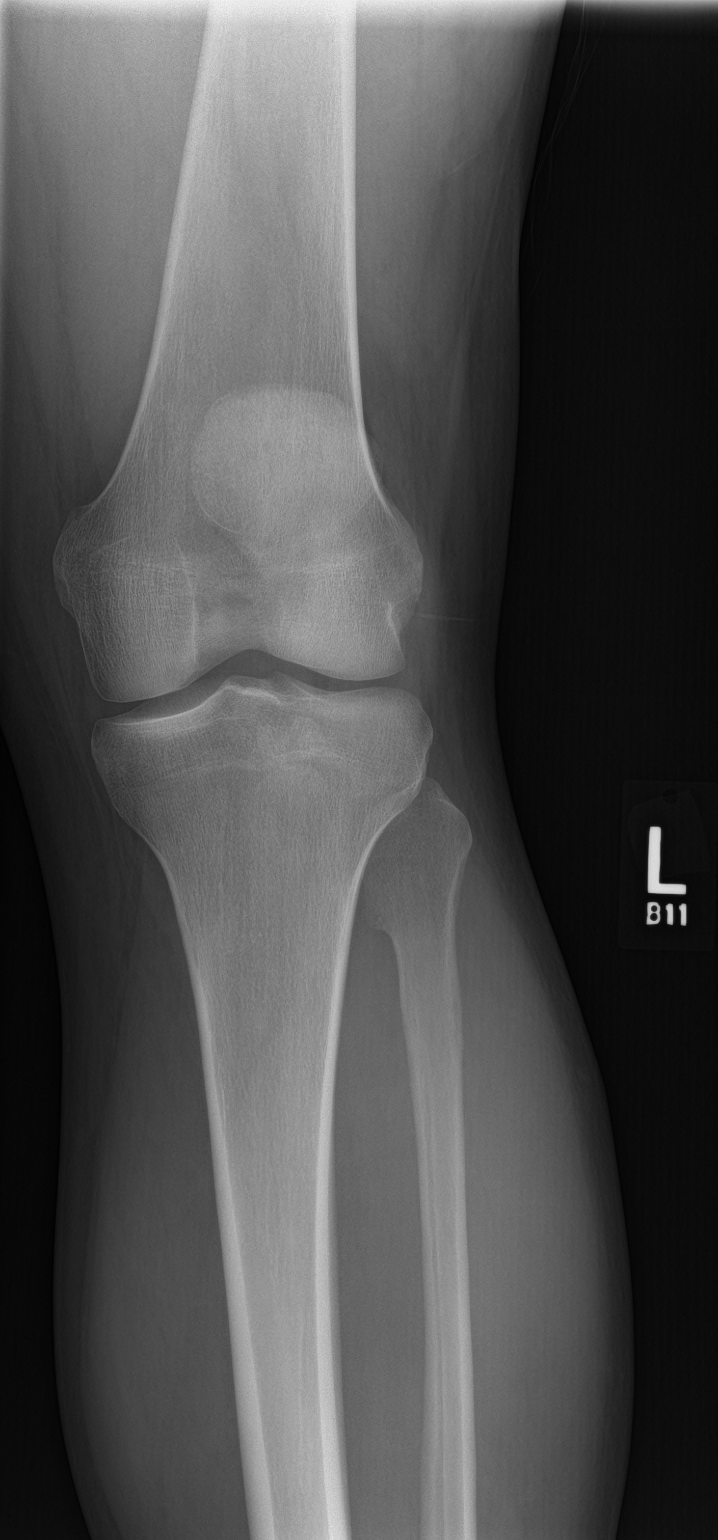
[im 2/4]
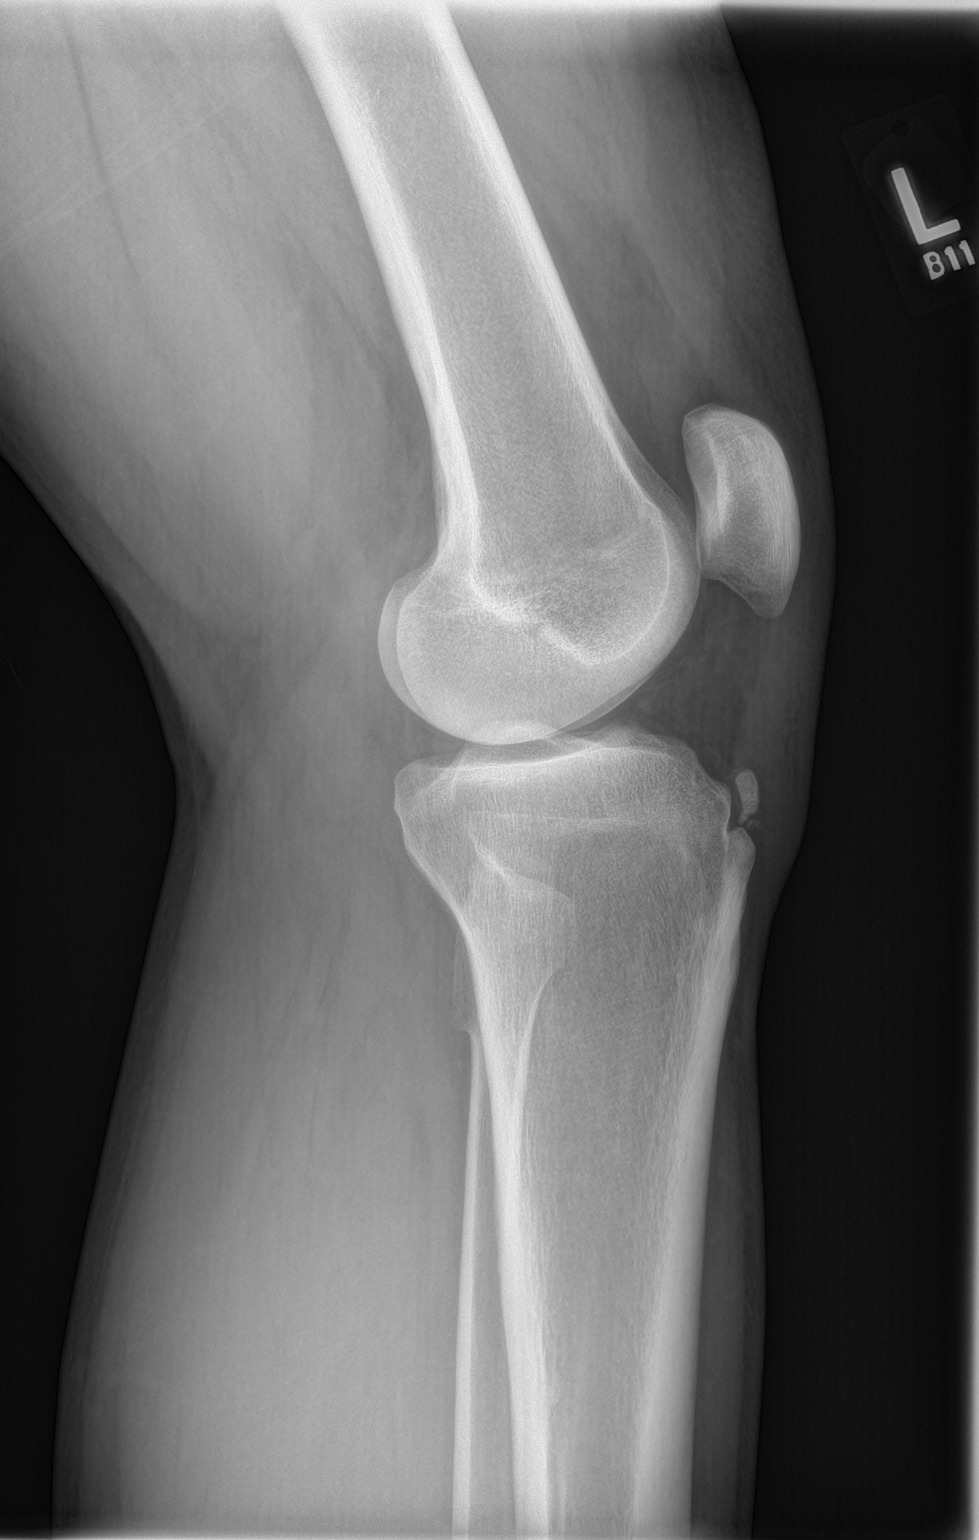
[im 3/4]
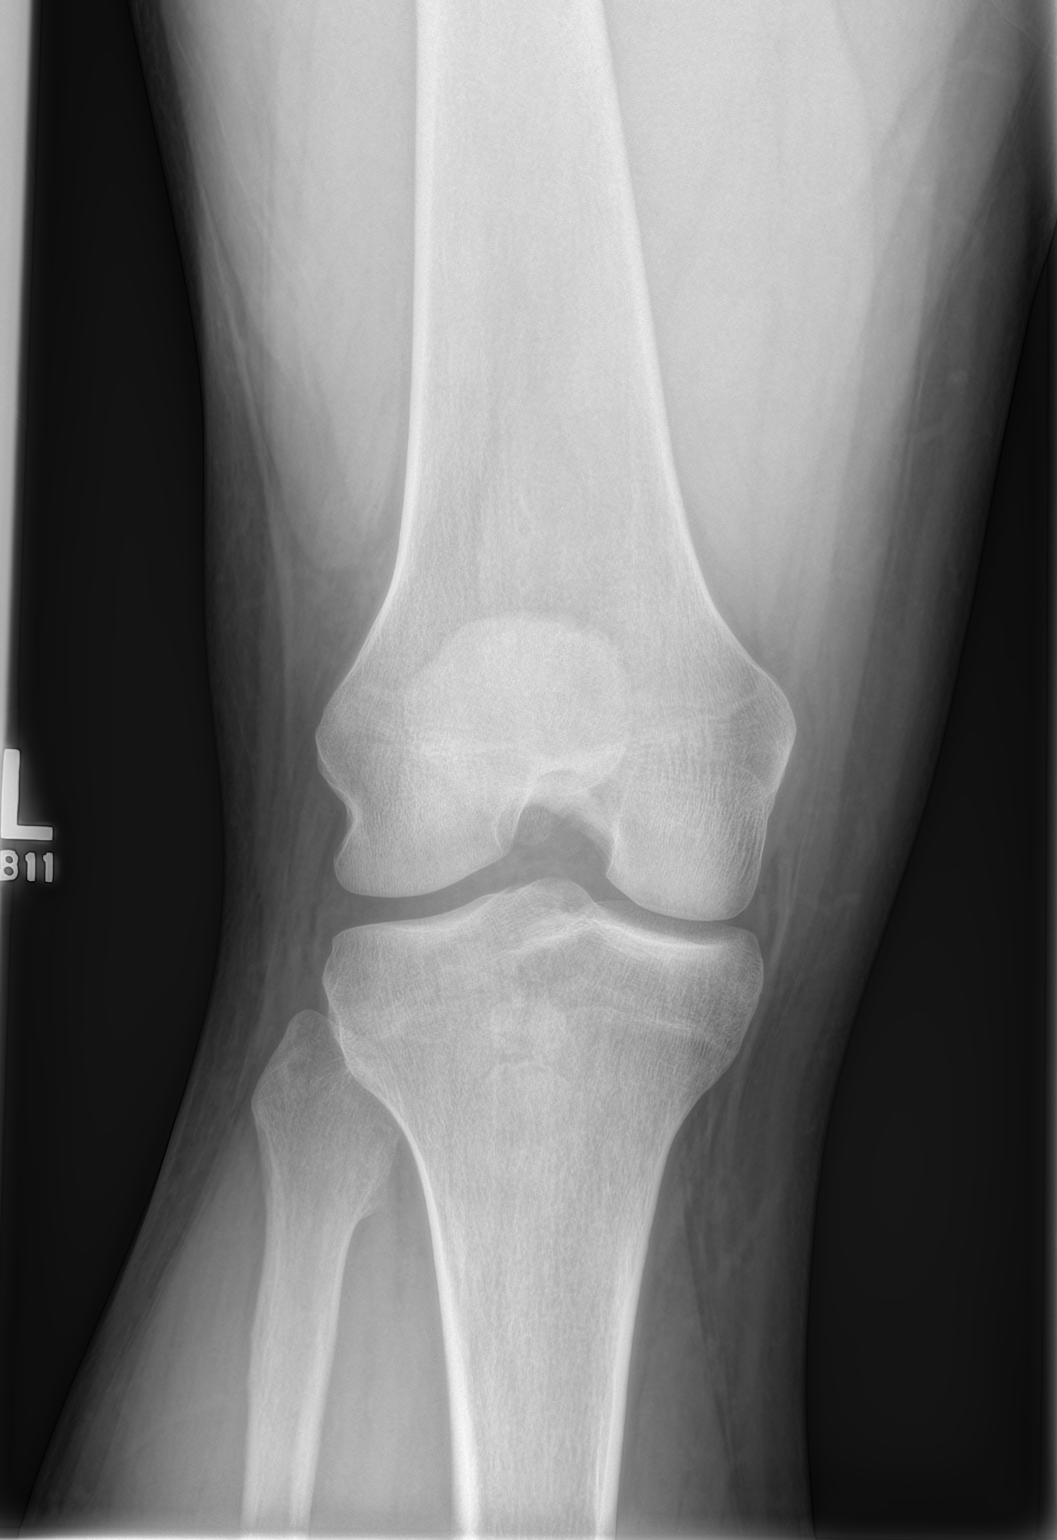
[im 4/4]
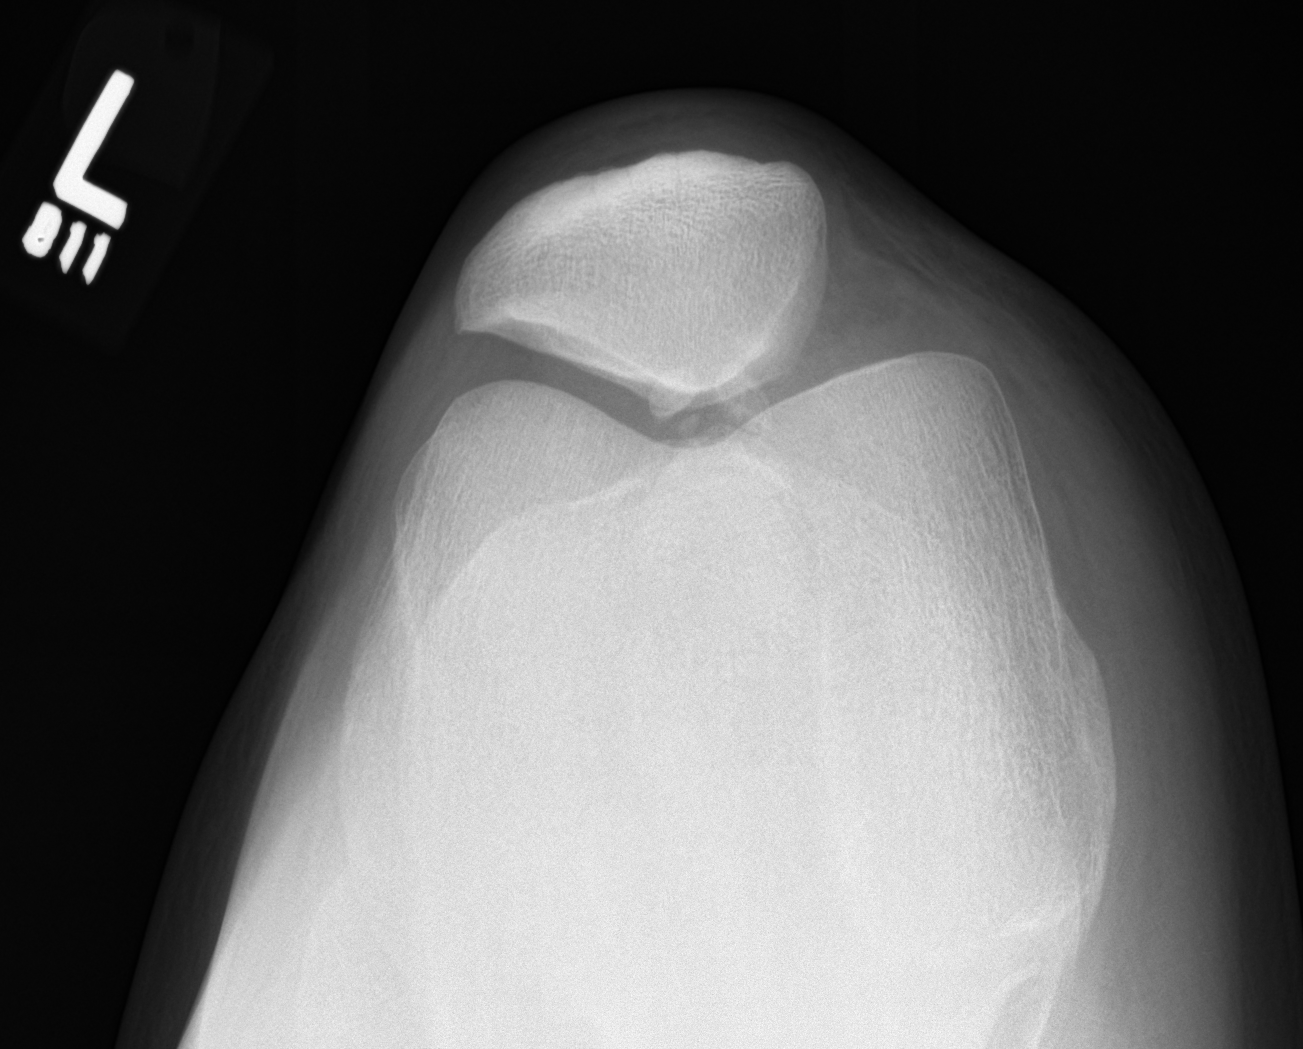

[4 of 4 positions shown; findings below may reference images not displayed]

FINDINGS: No evidence of fracture, dislocation, or joint effusion. No evidence
of arthropathy or other focal bone abnormality. Soft tissues are
unremarkable.
IMPRESSION: Normal left knee.

## 2019-03-18 IMAGING — CR DG CHEST 2V
1 series · 2 of 2 positions shown · non-contrast
Comparison: None.

CLINICAL DATA: Lump over mid chest adjacent distal sternum 3 months
with no pain or injury.

EXAM:
CHEST  2 VIEW

[Series 1: dg chest 2 view · 0.14mm/px · 2 of 2 slices shown]
[im 1/2]
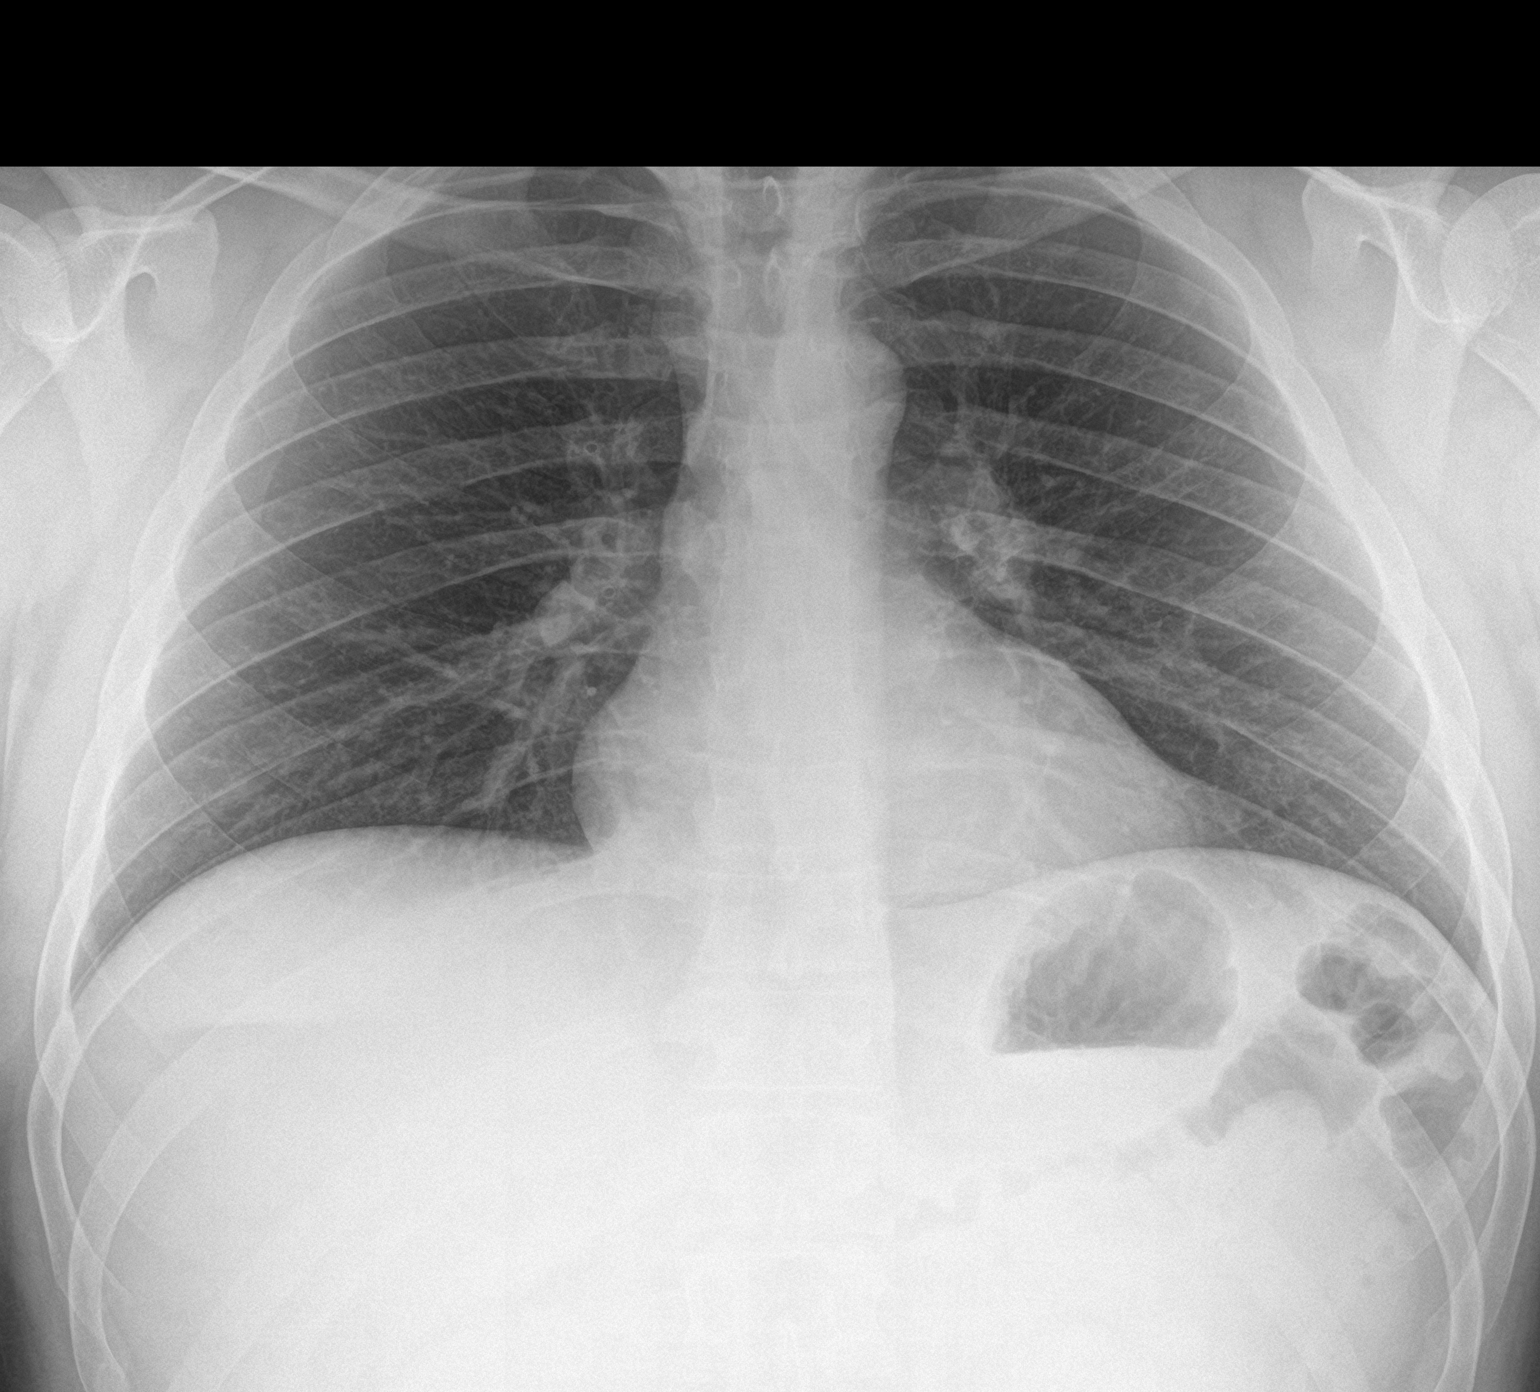
[im 2/2]
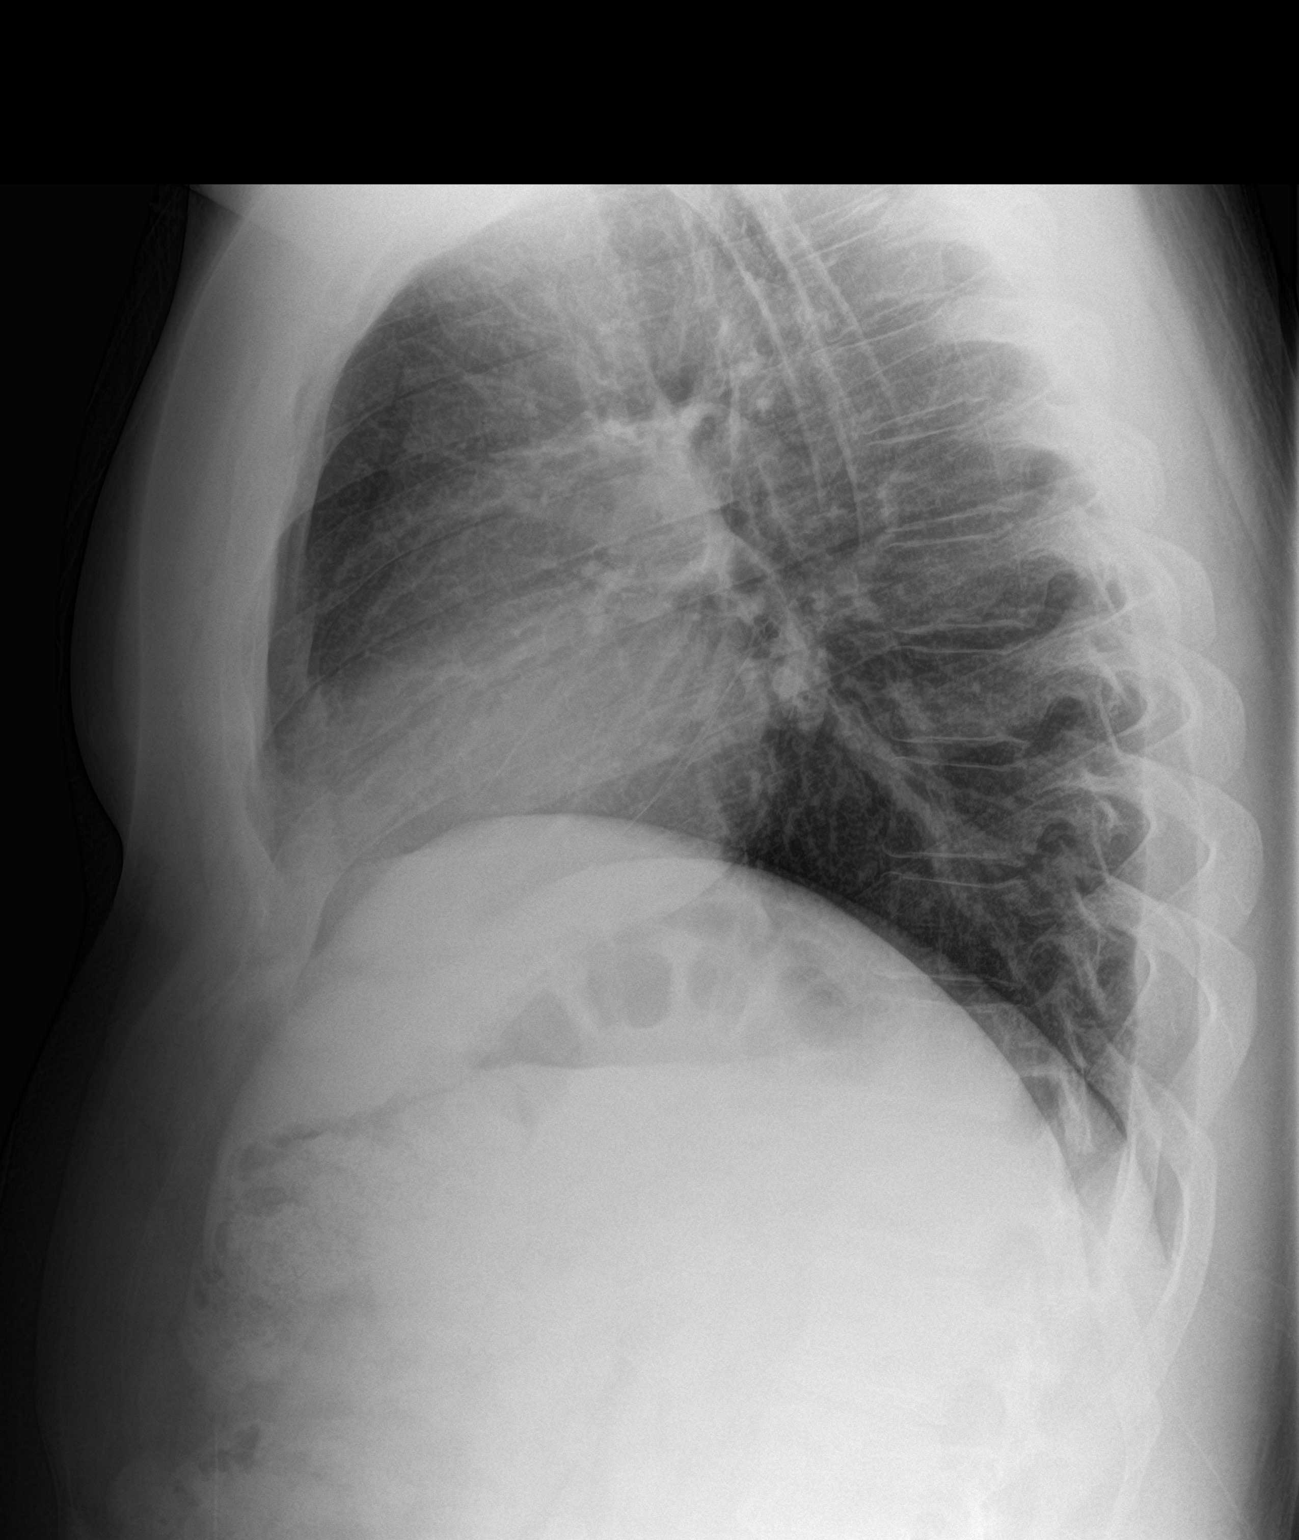

[2 of 2 positions shown; findings below may reference images not displayed]

FINDINGS: Lungs are clear. Cardiomediastinal silhouette is within normal. Bony
structures are within normal. Mild focal soft tissue prominence in
the subcutaneous tissue inferior to the sternum on the lateral film
which may represent the area of patient's palpable abnormality.
IMPRESSION: No active cardiopulmonary disease.

Mild focal soft tissue prominence in the subcutaneous tissues
inferior to the sternum on the lateral film which may represent
patient's palpable area. Consider ultrasound for further evaluation.

## 2019-07-15 ENCOUNTER — Other Ambulatory Visit: Payer: Self-pay | Admitting: Nurse Practitioner

## 2019-07-16 LAB — LIPID PANEL W/O CHOL/HDL RATIO
Cholesterol, Total: 175 mg/dL (ref 100–199)
HDL: 38 mg/dL — ABNORMAL LOW (ref >39)
LDL Chol Calc (NIH): 82 mg/dL (ref 0–99)
Triglycerides: 336 mg/dL — ABNORMAL HIGH (ref 0–149)
VLDL Cholesterol Cal: 55 mg/dL — ABNORMAL HIGH (ref 5–40)

## 2019-07-16 LAB — CBC
Hematocrit: 50.8 % (ref 37.5–51.0)
Hemoglobin: 17.5 g/dL (ref 13.0–17.7)
MCH: 30.2 pg (ref 26.6–33.0)
MCHC: 34.4 g/dL (ref 31.5–35.7)
MCV: 88 fL (ref 79–97)
Platelets: 268 10*3/uL (ref 150–450)
RBC: 5.8 x10E6/uL (ref 4.14–5.80)
RDW: 12.5 % (ref 11.6–15.4)
WBC: 10.7 10*3/uL (ref 3.4–10.8)

## 2019-07-16 LAB — COMPREHENSIVE METABOLIC PANEL
ALT: 43 IU/L (ref 0–44)
AST: 33 IU/L (ref 0–40)
Albumin/Globulin Ratio: 1.8 (ref 1.2–2.2)
Albumin: 4.9 g/dL (ref 4.0–5.0)
Alkaline Phosphatase: 65 IU/L (ref 39–117)
BUN/Creatinine Ratio: 15 (ref 9–20)
BUN: 16 mg/dL (ref 6–20)
Bilirubin Total: 0.5 mg/dL (ref 0.0–1.2)
CO2: 25 mmol/L (ref 20–29)
Calcium: 10 mg/dL (ref 8.7–10.2)
Chloride: 99 mmol/L (ref 96–106)
Creatinine, Ser: 1.04 mg/dL (ref 0.76–1.27)
GFR calc Af Amer: 110 mL/min/{1.73_m2} (ref >59)
GFR calc non Af Amer: 95 mL/min/{1.73_m2} (ref >59)
Globulin, Total: 2.7 g/dL (ref 1.5–4.5)
Glucose: 84 mg/dL (ref 65–99)
Potassium: 5.2 mmol/L (ref 3.5–5.2)
Sodium: 139 mmol/L (ref 134–144)
Total Protein: 7.6 g/dL (ref 6.0–8.5)

## 2019-07-16 LAB — T4, FREE: Free T4: 1.29 ng/dL (ref 0.82–1.77)

## 2019-07-16 LAB — TSH: TSH: 1.28 u[IU]/mL (ref 0.450–4.500)

## 2019-08-16 NOTE — Progress Notes (Signed)
Review at visit 08/20/2019

## 2019-08-20 ENCOUNTER — Ambulatory Visit: Payer: BLUE CROSS/BLUE SHIELD | Admitting: Nurse Practitioner

## 2019-09-24 ENCOUNTER — Telehealth: Payer: Self-pay

## 2019-09-24 NOTE — Telephone Encounter (Signed)
LMOM FOR PATIENT TO CONFIRM AND SCREEN FOR 09-28-19 OV. °

## 2019-09-28 ENCOUNTER — Encounter: Payer: Self-pay | Admitting: Nurse Practitioner

## 2019-09-28 ENCOUNTER — Ambulatory Visit: Payer: 59 | Admitting: Nurse Practitioner

## 2019-09-28 ENCOUNTER — Other Ambulatory Visit: Payer: Self-pay | Admitting: Nurse Practitioner

## 2019-09-28 ENCOUNTER — Other Ambulatory Visit: Payer: Self-pay

## 2019-09-28 VITALS — BP 138/80 | HR 65 | Temp 97.4°F | Ht 72.0 in | Wt 259.2 lb

## 2019-09-28 DIAGNOSIS — J309 Allergic rhinitis, unspecified: Secondary | ICD-10-CM | POA: Diagnosis not present

## 2019-09-28 DIAGNOSIS — E782 Mixed hyperlipidemia: Secondary | ICD-10-CM

## 2019-09-28 DIAGNOSIS — R69 Illness, unspecified: Secondary | ICD-10-CM | POA: Diagnosis not present

## 2019-09-28 DIAGNOSIS — F411 Generalized anxiety disorder: Secondary | ICD-10-CM

## 2019-09-28 MED ORDER — ROSUVASTATIN CALCIUM 10 MG PO TABS: 10.0000 mg | ORAL_TABLET | Freq: Every day | ORAL | 3 refills | Status: DC

## 2019-09-28 NOTE — Progress Notes (Signed)
[facility] [address] [place], [place] [address]  Internal MEDICINE  Office Visit Note  Patient Name: Mike Nolan  [id number]  [id number]  Date of Service: 09/28/2019  Chief Complaint  Patient presents with  . Follow-up    The patient is here for routine follow up. He had routine, fasting labs done in September. He had significantly elevated triglycerides. Was prescribed crestor, however he does not take it. He states that he heard it caused joint and muscle pain. He did not take this long enough to note if he had any negative side effects.  He is treated for chronic allergies which started when he was a child. He states that he treats this with mucinex d and claritin D. Both are effective. Has questions regarding allergy shots.       Current Medication: Outpatient Encounter Medications as of 09/28/2019  Medication Sig  . Ascorbic Acid (VITAMIN C) 1000 MG tablet Take 1,000 mg by mouth daily.  Marland Kitchen loratadine (CLARITIN) 10 MG tablet Take 10 mg by mouth daily.  . rosuvastatin (CRESTOR) 10 MG tablet Take 1 tablet (10 mg total) by mouth daily.  . [DISCONTINUED] busPIRone (BUSPAR) 10 MG tablet Take 1/2 to 1 tablet po BID prn anxiety (Patient not taking: Reported on 02/12/2019)  . [DISCONTINUED] rosuvastatin (CRESTOR) 10 MG tablet Take 1 tablet (10 mg total) by mouth daily. (Patient not taking: Reported on 02/12/2019)   No facility-administered encounter medications on file as of 09/28/2019.    Surgical History: Past Surgical History:  Procedure Laterality Date  . FOOT SURGERY    . NOSE SURGERY    . WISDOM TOOTH EXTRACTION      Medical History: Past Medical History:  Diagnosis Date  . Allergy   . Postural orthostatic tachycardia syndrome     Family History: Family History  Problem Relation Age of Onset  . Hypertension Mother   . Pancreatic cancer Paternal Grandmother     Social History   Socioeconomic History  . Marital status: Married    Spouse  name: Not on file  . Number of children: Not on file  . Years of education: Not on file  . Highest education level: Not on file  Occupational History  . Not on file  Tobacco Use  . Smoking status: Current Every Day Smoker    Packs/day: 1.00    Types: Cigarettes  . Smokeless tobacco: Current User    Types: Snuff  Substance and Sexual Activity  . Alcohol use: Yes    Comment: social  . Drug use: Not Currently  . Sexual activity: Not on file  Other Topics Concern  . Not on file  Social History Narrative  . Not on file   Social Determinants of Health   Financial Resource Strain:   . Difficulty of Paying Living Expenses: Not on file  Food Insecurity:   . Worried About Programme researcher, broadcasting/film/video in the Last Year: Not on file  . Ran Out of Food in the Last Year: Not on file  Transportation Needs:   . Lack of Transportation (Medical): Not on file  . Lack of Transportation (Non-Medical): Not on file  Physical Activity:   . Days of Exercise per Week: Not on file  . Minutes of Exercise per Session: Not on file  Stress:   . Feeling of Stress : Not on file  Social Connections:   . Frequency of Communication with Friends and Family: Not on file  . Frequency of Social Gatherings with  Friends and Family: Not on file  . Attends Religious Services: Not on file  . Active Member of Clubs or Organizations: Not on file  . Attends Archivist Meetings: Not on file  . Marital Status: Not on file  Intimate Partner Violence:   . Fear of Current or Ex-Partner: Not on file  . Emotionally Abused: Not on file  . Physically Abused: Not on file  . Sexually Abused: Not on file      Review of Systems  Constitutional: Negative for activity change, chills, fatigue and unexpected weight change.  HENT: Negative for congestion, postnasal drip, rhinorrhea, sneezing and sore throat.        Chronic allergic rhinitis  Respiratory: Negative for cough, chest tightness, shortness of breath and wheezing.    Cardiovascular: Negative for chest pain and palpitations.  Gastrointestinal: Negative for abdominal pain, constipation, diarrhea, nausea and vomiting.  Endocrine: Negative for cold intolerance, heat intolerance, polydipsia and polyuria.  Musculoskeletal: Negative for arthralgias, back pain, joint swelling and neck pain.  Skin: Negative for rash.  Allergic/Immunologic: Positive for environmental allergies.  Neurological: Negative for dizziness, tremors, numbness and headaches.  Hematological: Negative for adenopathy. Does not bruise/bleed easily.  Psychiatric/Behavioral: Negative for behavioral problems (Depression), sleep disturbance and suicidal ideas. The patient is nervous/anxious.        Well managed without medication.     Today's Vitals   09/28/19 0954  BP: 138/80  Pulse: 65  Temp: (!) 97.4 F (36.3 C)  SpO2: 98%  Weight: 259 lb 3.2 oz (117.6 kg)  Height: 6' (1.829 m)   Body mass index is 35.15 kg/m.  Physical Exam Vitals and nursing note reviewed.  Constitutional:      General: He is not in acute distress.    Appearance: Normal appearance. He is well-developed. He is not diaphoretic.  HENT:     Head: Normocephalic and atraumatic.     Nose: Nose normal.     Mouth/Throat:     Pharynx: No oropharyngeal exudate.  Eyes:     Pupils: Pupils are equal, round, and reactive to light.  Neck:     Thyroid: No thyromegaly.     Vascular: No JVD.     Trachea: No tracheal deviation.  Cardiovascular:     Rate and Rhythm: Normal rate and regular rhythm.     Heart sounds: Normal heart sounds. No murmur. No friction rub. No gallop.   Pulmonary:     Effort: Pulmonary effort is normal. No respiratory distress.     Breath sounds: Normal breath sounds. No wheezing or rales.  Chest:     Chest wall: No tenderness.  Abdominal:     Palpations: Abdomen is soft.  Musculoskeletal:        General: Normal range of motion.     Cervical back: Normal range of motion and neck supple.   Lymphadenopathy:     Cervical: No cervical adenopathy.  Skin:    General: Skin is warm and dry.  Neurological:     Mental Status: He is alert and oriented to person, place, and time.     Cranial Nerves: No cranial nerve deficit.  Psychiatric:        Behavior: Behavior normal.        Thought Content: Thought content normal.        Judgment: Judgment normal.    Assessment/Plan:  1. Mixed hyperlipidemia Restart crestor 10mg  every evening. Recommend he take with CoQ10 every day. Recheck fasting lipid panel and CKP prior to  next visit.  - rosuvastatin (CRESTOR) 10 MG tablet; Take 1 tablet (10 mg total) by mouth daily.  Dispense: 30 tablet; Refill: 3  2. Chronic allergic rhinitis Managing well with OTC medication. Discuss allergy testing and shots if symptoms no longer controlled with medication.   3. Generalized anxiety disorder Well managed without medication.   General Counseling: Joseph ArtBrian verbalizes understanding of the findings of todays visit and agrees with plan of treatment. I have discussed any further diagnostic evaluation that may be needed or ordered today. We also reviewed his medications today. he has been encouraged to call the office with any questions or concerns that should arise related to todays visit.   This patient was seen by Vincent GrosHeather Almena Hokenson FNP Collaboration with Dr Lyndon CodeFozia M Khan as a part of collaborative care agreement  Meds ordered this encounter  Medications  . rosuvastatin (CRESTOR) 10 MG tablet    Sig: Take 1 tablet (10 mg total) by mouth daily.    Dispense:  30 tablet    Refill:  3    Order Specific Question:   Supervising Provider    Answer:   Lyndon CodeKHAN, FOZIA M [[id number]]    Time spent: 4415 Minutes      Dr Lyndon CodeFozia M Khan Internal medicine

## 2019-09-29 LAB — LIPID PANEL W/O CHOL/HDL RATIO
Cholesterol, Total: 169 mg/dL (ref 100–199)
HDL: 41 mg/dL (ref >39)
LDL Chol Calc (NIH): 88 mg/dL (ref 0–99)
Triglycerides: 241 mg/dL — ABNORMAL HIGH (ref 0–149)
VLDL Cholesterol Cal: 40 mg/dL (ref 5–40)

## 2019-09-29 LAB — COMPREHENSIVE METABOLIC PANEL
ALT: 30 IU/L (ref 0–44)
AST: 24 IU/L (ref 0–40)
Albumin/Globulin Ratio: 2 (ref 1.2–2.2)
Albumin: 4.7 g/dL (ref 4.0–5.0)
Alkaline Phosphatase: 65 IU/L (ref 39–117)
BUN/Creatinine Ratio: 11 (ref 9–20)
BUN: 10 mg/dL (ref 6–20)
Bilirubin Total: 0.5 mg/dL (ref 0.0–1.2)
CO2: 21 mmol/L (ref 20–29)
Calcium: 9.7 mg/dL (ref 8.7–10.2)
Chloride: 103 mmol/L (ref 96–106)
Creatinine, Ser: 0.92 mg/dL (ref 0.76–1.27)
GFR calc Af Amer: 128 mL/min/{1.73_m2} (ref >59)
GFR calc non Af Amer: 110 mL/min/{1.73_m2} (ref >59)
Globulin, Total: 2.4 g/dL (ref 1.5–4.5)
Glucose: 96 mg/dL (ref 65–99)
Potassium: 4.5 mmol/L (ref 3.5–5.2)
Sodium: 140 mmol/L (ref 134–144)
Total Protein: 7.1 g/dL (ref 6.0–8.5)

## 2019-12-24 ENCOUNTER — Telehealth: Payer: Self-pay

## 2019-12-24 NOTE — Telephone Encounter (Signed)
CONFIRMED AND SCREENED FOR 12-28-19 OV. 

## 2019-12-28 ENCOUNTER — Ambulatory Visit: Payer: 59 | Admitting: Nurse Practitioner

## 2019-12-28 ENCOUNTER — Encounter: Payer: Self-pay | Admitting: Nurse Practitioner

## 2019-12-28 ENCOUNTER — Other Ambulatory Visit: Payer: Self-pay

## 2019-12-28 VITALS — BP 121/80 | HR 70 | Temp 97.4°F | Resp 16 | Ht 72.0 in | Wt 243.2 lb

## 2019-12-28 DIAGNOSIS — E663 Overweight: Secondary | ICD-10-CM | POA: Diagnosis not present

## 2019-12-28 DIAGNOSIS — E781 Pure hyperglyceridemia: Secondary | ICD-10-CM | POA: Insufficient documentation

## 2019-12-28 MED ORDER — FENOFIBRATE 48 MG PO TABS: 48.0000 mg | ORAL_TABLET | Freq: Every day | ORAL | 3 refills | Status: DC

## 2019-12-28 NOTE — Progress Notes (Signed)
[facility] [address] [place], [place] [address]  Internal MEDICINE  Office Visit Note  Patient Name: Mike Nolan  [id number]  [id number]  Date of Service: 12/28/2019  Chief Complaint  Patient presents with  . Hyperlipidemia  . Allergies  . Follow-up    review labs   . Medication Management    would like to change cholesterol med because joints are bothering him     The patient is here for follow up visit. States that he has been unable to take crestor. After about 2 weeks, he started having joint pain in the elbows. Continues to take CoQ10. Has changed many habits to healthier ones. Has started drinking a lot more water everyday. He is not eating out, fast food, but once per week. Has cut back alcohol intake, and is exercising more frequency. He has lost 17 pounds since his last visit. He did have fasting lipid panel checked prior to this visit. Triglyceride levels improved by nearly 100 points. Total cholesterol and HDL are mildly improved as well. He has no new concerns or complaints today. Blood pressure is good.       Current Medication: Outpatient Encounter Medications as of 12/28/2019  Medication Sig  . Ascorbic Acid (VITAMIN C) 1000 MG tablet Take 1,000 mg by mouth daily.  Marland Kitchen loratadine (CLARITIN) 10 MG tablet Take 10 mg by mouth daily.  . rosuvastatin (CRESTOR) 10 MG tablet Take 1 tablet (10 mg total) by mouth daily.  . fenofibrate (TRICOR) 48 MG tablet Take 1 tablet (48 mg total) by mouth daily.   No facility-administered encounter medications on file as of 12/28/2019.    Surgical History: Past Surgical History:  Procedure Laterality Date  . FOOT SURGERY    . NOSE SURGERY    . WISDOM TOOTH EXTRACTION      Medical History: Past Medical History:  Diagnosis Date  . Allergy   . Postural orthostatic tachycardia syndrome     Family History: Family History  Problem Relation Age of Onset  . Hypertension Mother   . Pancreatic cancer Paternal  Grandmother     Social History   Socioeconomic History  . Marital status: Married    Spouse name: Not on file  . Number of children: Not on file  . Years of education: Not on file  . Highest education level: Not on file  Occupational History  . Not on file  Tobacco Use  . Smoking status: Current Every Day Smoker    Packs/day: 1.00    Types: Cigarettes  . Smokeless tobacco: Current User    Types: Snuff  Substance and Sexual Activity  . Alcohol use: Yes    Comment: ocassionally  . Drug use: Not Currently  . Sexual activity: Not on file  Other Topics Concern  . Not on file  Social History Narrative  . Not on file   Social Determinants of Health   Financial Resource Strain:   . Difficulty of Paying Living Expenses:   Food Insecurity:   . Worried About Programme researcher, broadcasting/film/video in the Last Year:   . Barista in the Last Year:   Transportation Needs:   . Freight forwarder (Medical):   Marland Kitchen Lack of Transportation (Non-Medical):   Physical Activity:   . Days of Exercise per Week:   . Minutes of Exercise per Session:   Stress:   . Feeling of Stress :   Social Connections:   . Frequency of Communication with Friends and  Family:   . Frequency of Social Gatherings with Friends and Family:   . Attends Religious Services:   . Active Member of Clubs or Organizations:   . Attends Archivist Meetings:   Marland Kitchen Marital Status:   Intimate Partner Violence:   . Fear of Current or Ex-Partner:   . Emotionally Abused:   Marland Kitchen Physically Abused:   . Sexually Abused:       Review of Systems  Constitutional: Negative for activity change, chills, fatigue and unexpected weight change.  HENT: Negative for congestion, postnasal drip, rhinorrhea, sneezing and sore throat.        Chronic allergic rhinitis  Respiratory: Negative for cough, chest tightness, shortness of breath and wheezing.   Cardiovascular: Negative for chest pain and palpitations.  Gastrointestinal: Negative for  abdominal pain, constipation, diarrhea, nausea and vomiting.  Endocrine: Negative for cold intolerance, heat intolerance, polydipsia and polyuria.  Musculoskeletal: Negative for arthralgias, back pain, joint swelling and neck pain.  Skin: Negative for rash.  Allergic/Immunologic: Negative for environmental allergies.  Neurological: Negative for dizziness, tremors, numbness and headaches.  Hematological: Negative for adenopathy. Does not bruise/bleed easily.  Psychiatric/Behavioral: Negative for behavioral problems (Depression), sleep disturbance and suicidal ideas. The patient is not nervous/anxious.    Today's Vitals   12/28/19 0924  BP: 121/80  Pulse: 70  Resp: 16  Temp: (!) 97.4 F (36.3 C)  SpO2: 99%  Weight: 243 lb 3.2 oz (110.3 kg)  Height: 6' (1.829 m)   Body mass index is 32.98 kg/m.  Physical Exam Vitals and nursing note reviewed.  Constitutional:      General: He is not in acute distress.    Appearance: Normal appearance. He is well-developed. He is not diaphoretic.  HENT:     Head: Normocephalic and atraumatic.     Nose: Nose normal.     Mouth/Throat:     Pharynx: No oropharyngeal exudate.  Eyes:     Pupils: Pupils are equal, round, and reactive to light.  Neck:     Thyroid: No thyromegaly.     Vascular: No JVD.     Trachea: No tracheal deviation.  Cardiovascular:     Rate and Rhythm: Normal rate and regular rhythm.     Heart sounds: Normal heart sounds. No murmur. No friction rub. No gallop.   Pulmonary:     Effort: Pulmonary effort is normal. No respiratory distress.     Breath sounds: Normal breath sounds. No wheezing or rales.  Chest:     Chest wall: No tenderness.  Abdominal:     Palpations: Abdomen is soft.  Musculoskeletal:        General: Normal range of motion.     Cervical back: Normal range of motion and neck supple.  Lymphadenopathy:     Cervical: No cervical adenopathy.  Skin:    General: Skin is warm and dry.  Neurological:     Mental  Status: He is alert and oriented to person, place, and time.     Cranial Nerves: No cranial nerve deficit.  Psychiatric:        Mood and Affect: Mood normal.        Behavior: Behavior normal.        Thought Content: Thought content normal.        Judgment: Judgment normal.   Assessment/Plan: 1. Hypertriglyceridemia Improved. Triglyceride level improved from 336 to 241. Unable to tolerate statin medication. Will start fenofibrate 48mg  daily. He should continue with diet and lifestyle changes.  -  fenofibrate (TRICOR) 48 MG tablet; Take 1 tablet (48 mg total) by mouth daily.  Dispense: 30 tablet; Refill: 3  2. Overweight Improving. Had 17 pound weight loss since his last visit. Advised him to continue with diet and lifestyle changes which have positively effected his weight.   General Counseling: burdell peed understanding of the findings of todays visit and agrees with plan of treatment. I have discussed any further diagnostic evaluation that may be needed or ordered today. We also reviewed his medications today. he has been encouraged to call the office with any questions or concerns that should arise related to todays visit.  This patient was seen by Vincent Gros FNP Collaboration with Dr Lyndon Code as a part of collaborative care agreement  Meds ordered this encounter  Medications  . fenofibrate (TRICOR) 48 MG tablet    Sig: Take 1 tablet (48 mg total) by mouth daily.    Dispense:  30 tablet    Refill:  3    Patient unable to tolerate rosuvastatin or other statins. Caused joint pain    Order Specific Question:   Supervising Provider    Answer:   Lyndon Code [[id number]]    Total time spent: 20 Minutes   Time spent includes review of chart, medications, test results, and follow up plan with the patient.      Dr Lyndon Code Internal medicine

## 2020-06-24 ENCOUNTER — Telehealth: Payer: Self-pay

## 2020-06-24 NOTE — Telephone Encounter (Signed)
Confirmed and screened for office visit 9/13

## 2020-06-27 ENCOUNTER — Encounter: Payer: 59 | Admitting: Nurse Practitioner

## 2020-07-25 ENCOUNTER — Ambulatory Visit (INDEPENDENT_AMBULATORY_CARE_PROVIDER_SITE_OTHER): Payer: 59 | Admitting: Nurse Practitioner

## 2020-07-25 ENCOUNTER — Other Ambulatory Visit: Payer: Self-pay

## 2020-07-25 ENCOUNTER — Encounter: Payer: Self-pay | Admitting: Nurse Practitioner

## 2020-07-25 VITALS — BP 122/92 | HR 82 | Temp 97.9°F | Resp 16 | Ht 72.0 in | Wt 243.6 lb

## 2020-07-25 DIAGNOSIS — R300 Dysuria: Secondary | ICD-10-CM

## 2020-07-25 DIAGNOSIS — E782 Mixed hyperlipidemia: Secondary | ICD-10-CM

## 2020-07-25 DIAGNOSIS — Z28310 Unvaccinated for covid-19: Secondary | ICD-10-CM

## 2020-07-25 DIAGNOSIS — Z0001 Encounter for general adult medical examination with abnormal findings: Secondary | ICD-10-CM

## 2020-07-25 DIAGNOSIS — Z2821 Immunization not carried out because of patient refusal: Secondary | ICD-10-CM

## 2020-07-25 NOTE — Progress Notes (Signed)
[facility] [address] [place], [place] [address]  Internal MEDICINE  Office Visit Note  Patient Name: Mike Nolan  [id number]  [id number]  Date of Service: 08/10/2020   Pt is here for routine health maintenance examination  Chief Complaint  Patient presents with  . Annual Exam    covid vaccine   . Quality Metric Gaps    HepC,TDAP  . policy update form    received     The patient is here for health maintenance exam. Concerned about getting COVID 19 vaccine. He is reluctant to get this as there has not been enough time to research the safety of the vaccine. His employer is going to start charging him $70 per month starting in 10/2020 if he does not get vaccinated. He has no other concerns or complaints.  His blood pressure is well managed. States that he has not been taking cholesterol medication. Would like to check values before he restarts medication as he has made several diet and lifestyle changes to lower cholesterol and is exercising much more frequently.     Current Medication: Outpatient Encounter Medications as of 07/25/2020  Medication Sig  . guaiFENesin (MUCINEX) 600 MG 12 hr tablet Take by mouth every morning.  . Ascorbic Acid (VITAMIN C) 1000 MG tablet Take 1,000 mg by mouth daily. (Patient not taking: Reported on 07/25/2020)  . fenofibrate (TRICOR) 48 MG tablet Take 1 tablet (48 mg total) by mouth daily. (Patient not taking: Reported on 07/25/2020)  . loratadine (CLARITIN) 10 MG tablet Take 10 mg by mouth daily. (Patient not taking: Reported on 07/25/2020)  . rosuvastatin (CRESTOR) 10 MG tablet Take 1 tablet (10 mg total) by mouth daily. (Patient not taking: Reported on 07/25/2020)   No facility-administered encounter medications on file as of 07/25/2020.    Surgical History: Past Surgical History:  Procedure Laterality Date  . FOOT SURGERY    . NOSE SURGERY    . WISDOM TOOTH EXTRACTION      Medical History: Past Medical History:   Diagnosis Date  . Allergy   . Postural orthostatic tachycardia syndrome     Family History: Family History  Problem Relation Age of Onset  . Hypertension Mother   . Pancreatic cancer Paternal Grandmother       Review of Systems  Constitutional: Negative for activity change, chills, fatigue and unexpected weight change.  HENT: Negative for congestion, postnasal drip, rhinorrhea, sneezing and sore throat.   Respiratory: Negative for cough, chest tightness, shortness of breath and wheezing.   Cardiovascular: Negative for chest pain and palpitations.  Gastrointestinal: Negative for abdominal pain, constipation, diarrhea, nausea and vomiting.  Endocrine: Negative for cold intolerance, heat intolerance, polydipsia and polyuria.  Genitourinary: Negative for dysuria, frequency, hematuria and urgency.  Musculoskeletal: Negative for arthralgias, back pain, joint swelling and neck pain.  Skin: Negative for rash.  Allergic/Immunologic: Negative for environmental allergies.  Neurological: Negative for dizziness, tremors, numbness and headaches.  Hematological: Negative for adenopathy. Does not bruise/bleed easily.  Psychiatric/Behavioral: Negative for behavioral problems (Depression), sleep disturbance and suicidal ideas. The patient is not nervous/anxious.     Today's Vitals   07/25/20 1605  BP: (!) 122/92  Pulse: 82  Resp: 16  Temp: 97.9 F (36.6 C)  SpO2: 98%  Weight: 243 lb 9.6 oz (110.5 kg)  Height: 6' (1.829 m)   Body mass index is 33.04 kg/m.  Physical Exam Vitals and nursing note reviewed.  Constitutional:      General: He is not  in acute distress.    Appearance: Normal appearance. He is well-developed. He is not diaphoretic.  HENT:     Head: Normocephalic and atraumatic.     Nose: Nose normal.     Mouth/Throat:     Pharynx: No oropharyngeal exudate.  Eyes:     Pupils: Pupils are equal, round, and reactive to light.  Neck:     Thyroid: No thyromegaly.      Vascular: No JVD.     Trachea: No tracheal deviation.  Cardiovascular:     Rate and Rhythm: Normal rate and regular rhythm.     Pulses: Normal pulses.     Heart sounds: Normal heart sounds. No murmur heard.  No friction rub. No gallop.   Pulmonary:     Effort: Pulmonary effort is normal. No respiratory distress.     Breath sounds: Normal breath sounds. No wheezing or rales.  Chest:     Chest wall: No tenderness.  Abdominal:     General: Bowel sounds are normal.     Palpations: Abdomen is soft.     Tenderness: There is no abdominal tenderness.  Musculoskeletal:        General: Normal range of motion.     Cervical back: Normal range of motion and neck supple.  Lymphadenopathy:     Cervical: No cervical adenopathy.  Skin:    General: Skin is warm and dry.     Capillary Refill: Capillary refill takes less than 2 seconds.  Neurological:     General: No focal deficit present.     Mental Status: He is alert and oriented to person, place, and time.     Cranial Nerves: No cranial nerve deficit.  Psychiatric:        Mood and Affect: Mood normal.        Behavior: Behavior normal.        Thought Content: Thought content normal.        Judgment: Judgment normal.      LABS: Recent Results (from the past 2160 hour(s))  UA/M w/rflx Culture, Routine     Status: None   Collection Time: 07/25/20  5:03 PM   Specimen: Urine   Urine  Result Value Ref Range   Specific Gravity, UA 1.021 1.005 - 1.030   pH, UA 7.0 5.0 - 7.5   Color, UA Yellow Yellow   Appearance Ur Clear Clear   Leukocytes,UA Negative Negative   Protein,UA Negative Negative/Trace   Glucose, UA Negative Negative   Ketones, UA Negative Negative   RBC, UA Negative Negative   Bilirubin, UA Negative Negative   Urobilinogen, Ur 0.2 0.2 - 1.0 mg/dL   Nitrite, UA Negative Negative   Microscopic Examination Comment     Comment: Microscopic follows if indicated.   Microscopic Examination See below:     Comment: Microscopic  was indicated and was performed.   Urinalysis Reflex Comment     Comment: This specimen will not reflex to a Urine Culture.  Microscopic Examination     Status: None   Collection Time: 07/25/20  5:03 PM   Urine  Result Value Ref Range   WBC, UA None seen 0 - 5 /hpf   RBC 0-2 0 - 2 /hpf   Epithelial Cells (non renal) None seen 0 - 10 /hpf   Casts None seen None seen /lpf   Bacteria, UA None seen None seen/Few    Assessment/Plan: 1. Encounter for general adult medical examination with abnormal findings Annual health maintenance exam today. Order  slip given to check routine, fasting labs  2. Mixed hyperlipidemia Check fasting lipid panel and restart statin therapy as indicated   3. Dysuria - UA/M w/rflx Culture, Routine  4. COVID-19 vaccine series declined Discussed risks and benefits of getting vaccinated for COVID 19. He is not eligible for medical exemption from vaccine. Patient declines vaccine at this time.   General Counseling: makena mcgrady understanding of the findings of todays visit and agrees with plan of treatment. I have discussed any further diagnostic evaluation that may be needed or ordered today. We also reviewed his medications today. he has been encouraged to call the office with any questions or concerns that should arise related to todays visit.    Counseling:  This patient was seen by Vincent Gros FNP Collaboration with Dr Lyndon Code as a part of collaborative care agreement  Orders Placed This Encounter  Procedures  . Microscopic Examination  . UA/M w/rflx Culture, Routine     Total time spent: 45 Minutes  Time spent includes review of chart, medications, test results, and follow up plan with the patient.     Lyndon Code, MD  Internal Medicine

## 2020-07-26 LAB — UA/M W/RFLX CULTURE, ROUTINE
Bilirubin, UA: NEGATIVE
Glucose, UA: NEGATIVE
Ketones, UA: NEGATIVE
Leukocytes,UA: NEGATIVE
Nitrite, UA: NEGATIVE
Protein,UA: NEGATIVE
RBC, UA: NEGATIVE
Specific Gravity, UA: 1.021 (ref 1.005–1.030)
Urobilinogen, Ur: 0.2 mg/dL (ref 0.2–1.0)
pH, UA: 7 (ref 5.0–7.5)

## 2020-07-26 LAB — MICROSCOPIC EXAMINATION
Bacteria, UA: NONE SEEN
Casts: NONE SEEN /lpf
Epithelial Cells (non renal): NONE SEEN /hpf (ref 0–10)
WBC, UA: NONE SEEN /hpf (ref 0–5)

## 2020-08-10 DIAGNOSIS — R300 Dysuria: Secondary | ICD-10-CM | POA: Insufficient documentation

## 2020-08-10 DIAGNOSIS — Z2821 Immunization not carried out because of patient refusal: Secondary | ICD-10-CM | POA: Insufficient documentation

## 2020-08-10 DIAGNOSIS — Z28310 Unvaccinated for covid-19: Secondary | ICD-10-CM | POA: Insufficient documentation

## 2020-10-17 ENCOUNTER — Other Ambulatory Visit: Payer: Self-pay | Admitting: Nurse Practitioner

## 2020-10-17 DIAGNOSIS — Z0001 Encounter for general adult medical examination with abnormal findings: Secondary | ICD-10-CM | POA: Diagnosis not present

## 2020-10-17 DIAGNOSIS — E782 Mixed hyperlipidemia: Secondary | ICD-10-CM | POA: Diagnosis not present

## 2020-10-17 LAB — BASIC METABOLIC PANEL WITH GFR: Glucose: 89

## 2020-10-18 LAB — CBC
Hematocrit: 50.9 % (ref 37.5–51.0)
Hemoglobin: 17.3 g/dL (ref 13.0–17.7)
MCH: 29.7 pg (ref 26.6–33.0)
MCHC: 34 g/dL (ref 31.5–35.7)
MCV: 87 fL (ref 79–97)
Platelets: 254 10*3/uL (ref 150–450)
RBC: 5.83 x10E6/uL — ABNORMAL HIGH (ref 4.14–5.80)
RDW: 12.3 % (ref 11.6–15.4)
WBC: 8.2 10*3/uL (ref 3.4–10.8)

## 2020-10-18 LAB — COMPREHENSIVE METABOLIC PANEL
ALT: 28 IU/L (ref 0–44)
AST: 24 IU/L (ref 0–40)
Albumin/Globulin Ratio: 2.1 (ref 1.2–2.2)
Albumin: 4.8 g/dL (ref 4.0–5.0)
Alkaline Phosphatase: 70 IU/L (ref 44–121)
BUN/Creatinine Ratio: 10 (ref 9–20)
BUN: 10 mg/dL (ref 6–20)
Bilirubin Total: 0.6 mg/dL (ref 0.0–1.2)
CO2: 23 mmol/L (ref 20–29)
Calcium: 9.8 mg/dL (ref 8.7–10.2)
Chloride: 103 mmol/L (ref 96–106)
Creatinine, Ser: 0.96 mg/dL (ref 0.76–1.27)
GFR calc Af Amer: 120 mL/min/{1.73_m2} (ref >59)
GFR calc non Af Amer: 104 mL/min/{1.73_m2} (ref >59)
Globulin, Total: 2.3 g/dL (ref 1.5–4.5)
Glucose: 89 mg/dL (ref 65–99)
Potassium: 4.6 mmol/L (ref 3.5–5.2)
Sodium: 139 mmol/L (ref 134–144)
Total Protein: 7.1 g/dL (ref 6.0–8.5)

## 2020-10-18 LAB — T4, FREE: Free T4: 1.23 ng/dL (ref 0.82–1.77)

## 2020-10-18 LAB — TSH: TSH: 1.7 u[IU]/mL (ref 0.450–4.500)

## 2020-10-18 LAB — LIPID PANEL W/O CHOL/HDL RATIO
Cholesterol, Total: 177 mg/dL (ref 100–199)
HDL: 36 mg/dL — ABNORMAL LOW (ref >39)
LDL Chol Calc (NIH): 92 mg/dL (ref 0–99)
Triglycerides: 293 mg/dL — ABNORMAL HIGH (ref 0–149)
VLDL Cholesterol Cal: 49 mg/dL — ABNORMAL HIGH (ref 5–40)

## 2020-10-19 ENCOUNTER — Other Ambulatory Visit: Payer: Self-pay

## 2020-10-19 ENCOUNTER — Ambulatory Visit: Admission: EM | Admit: 2020-10-19 | Discharge: 2020-10-19 | Discharge status: Absent without leave | Payer: 59

## 2020-10-19 DIAGNOSIS — R6889 Other general symptoms and signs: Secondary | ICD-10-CM | POA: Diagnosis not present

## 2020-10-20 ENCOUNTER — Encounter: Payer: Self-pay | Admitting: Nurse Practitioner

## 2020-10-20 ENCOUNTER — Ambulatory Visit: Payer: 59 | Admitting: Nurse Practitioner

## 2020-10-20 VITALS — Temp 97.8°F | Resp 16 | Ht 72.0 in | Wt 250.0 lb

## 2020-10-20 DIAGNOSIS — J0140 Acute pansinusitis, unspecified: Secondary | ICD-10-CM | POA: Diagnosis not present

## 2020-10-20 DIAGNOSIS — H6690 Otitis media, unspecified, unspecified ear: Secondary | ICD-10-CM | POA: Insufficient documentation

## 2020-10-20 DIAGNOSIS — J300 Vasomotor rhinitis: Secondary | ICD-10-CM | POA: Diagnosis not present

## 2020-10-20 MED ORDER — FLUTICASONE PROPIONATE 50 MCG/ACT NA SUSP: 2.0000 | Freq: Every day | NASAL | 6 refills | Status: AC

## 2020-10-20 MED ORDER — AZITHROMYCIN 250 MG PO TABS: ORAL_TABLET | ORAL | 0 refills | Status: DC

## 2020-10-20 MED ORDER — CIPROFLOXACIN-DEXAMETHASONE 0.3-0.1 % OT SUSP: 4.0000 [drp] | Freq: Two times a day (BID) | OTIC | 0 refills | Status: DC

## 2020-10-20 NOTE — Progress Notes (Signed)
Patient no longer taking cholesterol medication. May need to restart

## 2020-10-20 NOTE — Progress Notes (Cosign Needed)
[facility] [place], [place] [address]  Internal MEDICINE  Telephone Visit  Patient Name: Mike Nolan  [id number]  [id number]  Date of Service: 10/20/2020  I connected with the patient at 12:59pm by telephone and verified the patients identity using two identifiers.   I discussed the limitations, risks, security and privacy concerns of performing an evaluation and management service by telephone and the availability of in person appointments. I also discussed with the patient that there may be a patient responsible charge related to the service.  The patient expressed understanding and agrees to proceed.    Chief Complaint  Patient presents with  . Telephone Screen  . telephone assesment  . acute visit  . Sore Throat  . Ear Pain  . Eye Pain    Pressure behind eyes    The patient has been contacted via telephone for follow up visit due to concerns for spread of novel coronavirus. States that on Monday, he started having headache and dizziness. States that he has a lot of pain behind his eyes. Denies fever. Denies nausea or vomiting. He states that it is possible that he has been exposed to Mike Nolan that both of his children and his parents have had COVID in recent months. States that he has never tested positive, himself. Is not vaccinated against COVID 19.       Current Medication: Outpatient Encounter Medications as of 10/20/2020  Medication Sig  . azithromycin (ZITHROMAX) 250 MG tablet z-pack - take as directed for 5 days  . ciprofloxacin-dexamethasone (CIPRODEX) OTIC suspension Place 4 drops into the right ear 2 (two) times daily.  . fluticasone (FLONASE) 50 MCG/ACT nasal spray Place 2 sprays into both nostrils daily.  . Ascorbic Acid (VITAMIN C) 1000 MG tablet Take 1,000 mg by mouth daily. (Patient not taking: No sig reported)  . fenofibrate (TRICOR) 48 MG tablet Take 1 tablet (48 mg total) by mouth daily. (Patient not taking: No sig reported)   . guaiFENesin (MUCINEX) 600 MG 12 hr tablet Take by mouth every morning. (Patient not taking: Reported on 10/20/2020)  . loratadine (CLARITIN) 10 MG tablet Take 10 mg by mouth daily. (Patient not taking: No sig reported)  . rosuvastatin (CRESTOR) 10 MG tablet Take 1 tablet (10 mg total) by mouth daily. (Patient not taking: No sig reported)   No facility-administered encounter medications on file as of 10/20/2020.    Surgical History: Past Surgical History:  Procedure Laterality Date  . FOOT SURGERY    . NOSE SURGERY    . WISDOM TOOTH EXTRACTION      Medical History: Past Medical History:  Diagnosis Date  . Allergy   . Postural orthostatic tachycardia syndrome     Family History: Family History  Problem Relation Age of Onset  . Hypertension Mother   . Pancreatic cancer Paternal Grandmother     Social History   Socioeconomic History  . Marital status: Married    Spouse name: Not on file  . Number of children: Not on file  . Years of education: Not on file  . Highest education level: Not on file  Occupational History  . Not on file  Tobacco Use  . Smoking status: Former Smoker    Packs/day: 1.00    Types: Cigarettes  . Smokeless tobacco: Current User    Types: Chew  Substance and Sexual Activity  . Alcohol use: Yes    Comment: ocassionally  . Drug use: Not Currently  . Sexual  activity: Not on file  Other Topics Concern  . Not on file  Social History Narrative  . Not on file   Social Determinants of Health   Financial Resource Strain: Not on file  Food Insecurity: Not on file  Transportation Needs: Not on file  Physical Activity: Not on file  Stress: Not on file  Social Connections: Not on file  Intimate Partner Violence: Not on file      Review of Systems  Constitutional: Positive for fatigue. Negative for activity change, chills, fever and unexpected weight change.  HENT: Positive for congestion, ear discharge, ear pain, postnasal drip, rhinorrhea,  sinus pressure, sinus pain and sore throat. Negative for sneezing.   Respiratory: Negative for cough, chest tightness, shortness of breath and wheezing.   Cardiovascular: Negative for chest pain and palpitations.  Gastrointestinal: Positive for diarrhea. Negative for abdominal pain, constipation, nausea and vomiting.  Musculoskeletal: Negative for arthralgias, back pain, joint swelling and neck pain.  Skin: Negative for rash.  Allergic/Immunologic: Positive for environmental allergies.  Neurological: Positive for dizziness and headaches. Negative for tremors and numbness.  Hematological: Positive for adenopathy. Does not bruise/bleed easily.  Psychiatric/Behavioral: Negative for behavioral problems (Depression), sleep disturbance and suicidal ideas. The patient is not nervous/anxious.     Today's Vitals   10/20/20 1039  Resp: 16  Temp: 97.8 F (36.6 C)  Weight: 250 lb (113.4 kg)  Height: 6' (1.829 m)   Body mass index is 33.91 kg/m.  Observation/Objective:  The patient is alert and oriented. He is pleasant and answering all questions appropriately. Breathing is non-labored. He is in no acute distress.  The patient is hoarse and sounds nasally congested.    Assessment/Plan: 1. Acute non-recurrent pansinusitis Start z-pack. Take as directed for 5 days. Add flonase nasal spray. Use two sprays in both nostrils daily. Rest and increase fluids. Continue using OTC medication to control symptoms.  - azithromycin (ZITHROMAX) 250 MG tablet; z-pack - take as directed for 5 days  Dispense: 6 tablet; Refill: 0 - fluticasone (FLONASE) 50 MCG/ACT nasal spray; Place 2 sprays into both nostrils daily.  Dispense: 16 g; Refill: 6  2. Acute otitis media, unspecified otitis media type Start ciprodex ear drops. Use four drops in both ears twice daily for next week.  - ciprofloxacin-dexamethasone (CIPRODEX) OTIC suspension; Place 4 drops into the right ear 2 (two) times daily.  Dispense: 7.5 mL; Refill:  0  3. Vasomotor rhinitis Add flonase nasal spray. Use two sprays in both nostrils daily.  - fluticasone (FLONASE) 50 MCG/ACT nasal spray; Place 2 sprays into both nostrils daily.  Dispense: 16 g; Refill: 6  General Counseling: Mike Nolan verbalizes understanding of the findings of today's phone visit and agrees with plan of treatment. I have discussed any further diagnostic evaluation that may be needed or ordered today. We also reviewed his medications today. he has been encouraged to call the office with any questions or concerns that should arise related to todays visit.   This patient was seen by Vincent Gros FNP Collaboration with Dr Lyndon Code as a part of collaborative care agreement  Meds ordered this encounter  Medications  . azithromycin (ZITHROMAX) 250 MG tablet    Sig: z-pack - take as directed for 5 days    Dispense:  6 tablet    Refill:  0    Order Specific Question:   Supervising Provider    Answer:   Lyndon Code [[id number]]  . fluticasone (FLONASE) 50 MCG/ACT nasal spray  Sig: Place 2 sprays into both nostrils daily.    Dispense:  16 g    Refill:  6    Order Specific Question:   Supervising Provider    Answer:   Lyndon Code [[id number]]  . ciprofloxacin-dexamethasone (CIPRODEX) OTIC suspension    Sig: Place 4 drops into the right ear 2 (two) times daily.    Dispense:  7.5 mL    Refill:  0    Order Specific Question:   Supervising Provider    Answer:   Lyndon Code [[id number]]    Time spent: 17 Minutes    Dr Lyndon Code Internal medicine

## 2020-10-31 ENCOUNTER — Other Ambulatory Visit: Payer: Self-pay

## 2020-10-31 ENCOUNTER — Ambulatory Visit
Admission: EM | Admit: 2020-10-31 | Discharge: 2020-10-31 | Disposition: A | Discharge status: Home / Self Care | Payer: 59

## 2020-10-31 ENCOUNTER — Encounter: Payer: Self-pay | Admitting: Emergency Medicine

## 2020-10-31 DIAGNOSIS — U071 COVID-19: Secondary | ICD-10-CM

## 2020-10-31 DIAGNOSIS — Z8616 Personal history of COVID-19: Secondary | ICD-10-CM | POA: Diagnosis not present

## 2020-10-31 NOTE — ED Provider Notes (Signed)
[facility]    CSN: [id number] Arrival date & time: 10/31/20  1236      History   Chief Complaint Chief Complaint  Patient presents with  . Covid Positive    HPI Mike Nolan is a 35 y.o. male.   Positive Covid test on 10/19/20.  States that he is requesting a note to return to work.  Denies cough, headache, nausea, vomiting, diarrhea, rash, fever, other symptoms.  Reports that he has not experienced any symptoms for the last few days.  ROS per HPI  The history is provided by the patient.    Past Medical History:  Diagnosis Date  . Allergy   . Postural orthostatic tachycardia syndrome     Patient Active Problem List   Diagnosis Date Noted  . Acute otitis media 10/20/2020  . Vasomotor rhinitis 10/20/2020  . Dysuria 08/10/2020  . COVID-19 vaccine series declined 08/10/2020  . Hypertriglyceridemia 12/28/2019  . Chronic allergic rhinitis 09/28/2019  . Mixed hyperlipidemia 08/12/2018  . Acute right ankle pain 05/02/2018  . Generalized anxiety disorder 05/02/2018  . Overweight 01/07/2018  . Encounter for general adult medical examination with abnormal findings 01/07/2018  . Acute non-recurrent pansinusitis 11/25/2017  . Left knee pain 11/25/2017  . Shortness of breath 11/25/2017  . Localized superficial mass 11/25/2017  . UNSPECIFIED CARDIAC DYSRHYTHMIA 04/30/2008  . FATIGUE 04/30/2008  . INSECT BITE, LEG 02/16/2008    Past Surgical History:  Procedure Laterality Date  . FOOT SURGERY    . NOSE SURGERY    . WISDOM TOOTH EXTRACTION         Home Medications    Prior to Admission medications   Medication Sig Start Date End Date Taking? Authorizing Provider  Ascorbic Acid (VITAMIN C) 1000 MG tablet Take 1,000 mg by mouth daily. Patient not taking: No sig reported    [provider]  azithromycin (ZITHROMAX) 250 MG tablet z-pack - take as directed for 5 days 10/20/20   Carlean Jews, NP  ciprofloxacin-dexamethasone (CIPRODEX) OTIC  suspension Place 4 drops into the right ear 2 (two) times daily. 10/20/20   Carlean Jews, NP  fenofibrate (TRICOR) 48 MG tablet Take 1 tablet (48 mg total) by mouth daily. Patient not taking: No sig reported 12/28/19   Carlean Jews, NP  fluticasone (FLONASE) 50 MCG/ACT nasal spray Place 2 sprays into both nostrils daily. 10/20/20   Carlean Jews, NP  guaiFENesin (MUCINEX) 600 MG 12 hr tablet Take by mouth every morning. Patient not taking: Reported on 10/20/2020    [provider]  loratadine (CLARITIN) 10 MG tablet Take 10 mg by mouth daily. Patient not taking: No sig reported    [provider]  rosuvastatin (CRESTOR) 10 MG tablet Take 1 tablet (10 mg total) by mouth daily. Patient not taking: No sig reported 09/28/19   Carlean Jews, NP    Family History Family History  Problem Relation Age of Onset  . Hypertension Mother   . Pancreatic cancer Paternal Grandmother     Social History Social History   Tobacco Use  . Smoking status: Former Smoker    Packs/day: 1.00    Types: Cigarettes  . Smokeless tobacco: Current User    Types: Chew  Substance Use Topics  . Alcohol use: Yes    Comment: ocassionally  . Drug use: Not Currently     Allergies   Bee venom   Review of Systems Review of Systems   Physical Exam Triage Vital Signs  ED Triage Vitals  Enc Vitals Group     BP 10/31/20 1309 136/82     Pulse Rate 10/31/20 1309 79     Resp 10/31/20 1309 16     Temp 10/31/20 1309 97.7 F (36.5 C)     Temp Source 10/31/20 1309 Oral     SpO2 10/31/20 1309 97 %     Weight --      Height --      Head Circumference --      Peak Flow --      Pain Score 10/31/20 1317 0     Pain Loc --      Pain Edu? --      Excl. in GC? --    No data found.  Updated Vital Signs BP 136/82 (BP Location: Right Arm)   Pulse 79   Temp 97.7 F (36.5 C) (Oral)   Resp 16   SpO2 97%   Visual Acuity Right Eye Distance:   Left Eye Distance:   Bilateral  Distance:    Right Eye Near:   Left Eye Near:    Bilateral Near:     Physical Exam Vitals and nursing note reviewed.  Constitutional:      General: He is not in acute distress.    Appearance: Normal appearance. He is well-developed and well-nourished. He is not ill-appearing.  HENT:     Head: Normocephalic and atraumatic.     Right Ear: Tympanic membrane, ear canal and external ear normal.     Left Ear: Tympanic membrane, ear canal and external ear normal.     Nose: Nose normal.     Mouth/Throat:     Mouth: Mucous membranes are moist.     Pharynx: Oropharynx is clear.  Eyes:     Extraocular Movements: Extraocular movements intact.     Conjunctiva/sclera: Conjunctivae normal.     Pupils: Pupils are equal, round, and reactive to light.  Cardiovascular:     Rate and Rhythm: Normal rate and regular rhythm.     Heart sounds: Normal heart sounds. No murmur heard.   Pulmonary:     Effort: Pulmonary effort is normal. No respiratory distress.     Breath sounds: Normal breath sounds. No stridor. No wheezing, rhonchi or rales.  Chest:     Chest wall: No tenderness.  Abdominal:     Palpations: Abdomen is soft.     Tenderness: There is no abdominal tenderness.  Musculoskeletal:        General: No edema. Normal range of motion.     Cervical back: Normal range of motion and neck supple.  Skin:    General: Skin is warm and dry.     Capillary Refill: Capillary refill takes less than 2 seconds.  Neurological:     General: No focal deficit present.     Mental Status: He is alert and oriented to person, place, and time.  Psychiatric:        Mood and Affect: Mood and affect and mood normal.        Behavior: Behavior normal.        Thought Content: Thought content normal.      UC Treatments / Results  Labs (all labs ordered are listed, but only abnormal results are displayed) Labs Reviewed - No data to display  EKG   Radiology No results found.  Procedures Procedures  (including critical care time)  Medications Ordered in UC Medications - No data to display  Initial Impression / Assessment and  Plan / UC Course  I have reviewed the triage vital signs and the nursing notes.  Pertinent labs & imaging results that were available during my care of the patient were reviewed by me and considered in my medical decision making (see chart for details).     History of Covid 34  Patient has history of COVID-19 Upon chart review, was able to find the note from [facility] on 10/19/2020 on the day that he tested positive Provided note that he may return to work with no restrictions Follow-up as needed Final Clinical Impressions(s) / UC Diagnoses   Final diagnoses:  COVID-19  History of COVID-19     Discharge Instructions     Follow up with this office or with primary care if symptoms are persisting.  Follow up in the ER for high fever, trouble swallowing, trouble breathing, other concerning symptoms.     ED Prescriptions    None     PDMP not reviewed this encounter.   Moshe Cipro, NP 10/31/20 1349

## 2020-10-31 NOTE — ED Triage Notes (Signed)
Pt dx with covid on 10/19/2020.  Needs a note for work stating he is ok to return to work.

## 2020-10-31 NOTE — Discharge Instructions (Addendum)
Follow up with this office or with primary care if symptoms are persisting. ° °Follow up in the ER for high fever, trouble swallowing, trouble breathing, other concerning symptoms. ° °

## 2021-01-23 ENCOUNTER — Encounter: Payer: Self-pay | Admitting: Hospice and Palliative Medicine

## 2021-01-23 ENCOUNTER — Other Ambulatory Visit: Payer: Self-pay

## 2021-01-23 ENCOUNTER — Ambulatory Visit: Payer: BC Managed Care – PPO | Admitting: Hospice and Palliative Medicine

## 2021-01-23 VITALS — BP 128/90 | HR 85 | Temp 97.1°F | Resp 16 | Ht 72.0 in | Wt 239.8 lb

## 2021-01-23 DIAGNOSIS — F17210 Nicotine dependence, cigarettes, uncomplicated: Secondary | ICD-10-CM | POA: Diagnosis not present

## 2021-01-23 DIAGNOSIS — E781 Pure hyperglyceridemia: Secondary | ICD-10-CM

## 2021-01-23 MED ORDER — FENOFIBRATE 48 MG PO TABS: 48.0000 mg | ORAL_TABLET | Freq: Every day | ORAL | 0 refills | Status: AC

## 2021-01-23 NOTE — Progress Notes (Signed)
[facility] [address] [place], [place] [address]  Internal MEDICINE  Office Visit Note  Patient Name: Mike Nolan  [id number]  [id number]  Date of Service: 01/23/2021  Chief Complaint  Patient presents with  . Follow-up    Discuss labs    HPI Patient is here for routine follow-up Has recovered from COVID History of hypertriglyceridemia--has been taking OTC Niacin to help control levels Has been trying to eat better, limiting his amount of fast food and eating out at restaurants Continues to drink about 12 beers each weekend and smoking about a pack of cigarettes per day Reviewed recent labs--triglyceride levels at 293, HDL 36--all others stable  Current Medication: Outpatient Encounter Medications as of 01/23/2021  Medication Sig  . Ascorbic Acid (VITAMIN C) 1000 MG tablet Take 1,000 mg by mouth daily.  . fenofibrate (TRICOR) 48 MG tablet Take 1 tablet (48 mg total) by mouth daily.  . fluticasone (FLONASE) 50 MCG/ACT nasal spray Place 2 sprays into both nostrils daily.  Marland Kitchen guaiFENesin (MUCINEX) 600 MG 12 hr tablet Take by mouth every morning.  . loratadine (CLARITIN) 10 MG tablet Take 10 mg by mouth daily.  . niacin 500 MG tablet Take 500 mg by mouth at bedtime.  . [DISCONTINUED] azithromycin (ZITHROMAX) 250 MG tablet z-pack - take as directed for 5 days (Patient not taking: Reported on 01/23/2021)  . [DISCONTINUED] ciprofloxacin-dexamethasone (CIPRODEX) OTIC suspension Place 4 drops into the right ear 2 (two) times daily. (Patient not taking: Reported on 01/23/2021)  . [DISCONTINUED] fenofibrate (TRICOR) 48 MG tablet Take 1 tablet (48 mg total) by mouth daily. (Patient not taking: No sig reported)  . [DISCONTINUED] rosuvastatin (CRESTOR) 10 MG tablet Take 1 tablet (10 mg total) by mouth daily. (Patient not taking: No sig reported)   No facility-administered encounter medications on file as of 01/23/2021.    Surgical History: Past Surgical History:  Procedure  Laterality Date  . FOOT SURGERY    . NOSE SURGERY    . WISDOM TOOTH EXTRACTION      Medical History: Past Medical History:  Diagnosis Date  . Allergy   . Postural orthostatic tachycardia syndrome     Family History: Family History  Problem Relation Age of Onset  . Hypertension Mother   . Pancreatic cancer Paternal Grandmother     Social History   Socioeconomic History  . Marital status: Married    Spouse name: Not on file  . Number of children: Not on file  . Years of education: Not on file  . Highest education level: Not on file  Occupational History  . Not on file  Tobacco Use  . Smoking status: Former Smoker    Packs/day: 1.00    Types: Cigarettes  . Smokeless tobacco: Current User    Types: Chew  Substance and Sexual Activity  . Alcohol use: Yes    Comment: ocassionally  . Drug use: Not Currently  . Sexual activity: Not on file  Other Topics Concern  . Not on file  Social History Narrative  . Not on file   Social Determinants of Health   Financial Resource Strain: Not on file  Food Insecurity: Not on file  Transportation Needs: Not on file  Physical Activity: Not on file  Stress: Not on file  Social Connections: Not on file  Intimate Partner Violence: Not on file      Review of Systems  Constitutional: Negative for chills, fatigue and unexpected weight change.  HENT: Negative for congestion, postnasal  drip, rhinorrhea, sneezing and sore throat.   Eyes: Negative for redness.  Respiratory: Negative for cough, chest tightness and shortness of breath.   Cardiovascular: Negative for chest pain and palpitations.  Gastrointestinal: Negative for abdominal pain, constipation, diarrhea, nausea and vomiting.  Genitourinary: Negative for dysuria and frequency.  Musculoskeletal: Negative for arthralgias, back pain, joint swelling and neck pain.  Skin: Negative for rash.  Neurological: Negative for tremors and numbness.  Hematological: Negative for  adenopathy. Does not bruise/bleed easily.  Psychiatric/Behavioral: Negative for behavioral problems (Depression), sleep disturbance and suicidal ideas. The patient is not nervous/anxious.     Vital Signs: BP 128/90   Pulse 85   Temp (!) 97.1 F (36.2 C)   Resp 16   Ht 6' (1.829 m)   Wt 239 lb 12.8 oz (108.8 kg)   SpO2 99%   BMI 32.52 kg/m    Physical Exam Vitals reviewed.  Constitutional:      Appearance: Normal appearance. He is normal weight.  Cardiovascular:     Rate and Rhythm: Normal rate and regular rhythm.     Pulses: Normal pulses.     Heart sounds: Normal heart sounds.  Pulmonary:     Effort: Pulmonary effort is normal.     Breath sounds: Normal breath sounds.  Abdominal:     General: Abdomen is flat.     Palpations: Abdomen is soft.  Musculoskeletal:        General: Normal range of motion.     Cervical back: Normal range of motion.  Skin:    General: Skin is warm.  Neurological:     General: No focal deficit present.     Mental Status: He is alert and oriented to person, place, and time. Mental status is at baseline.  Psychiatric:        Mood and Affect: Mood normal.        Behavior: Behavior normal.        Thought Content: Thought content normal.        Judgment: Judgment normal.    Assessment/Plan: 1. Hypertriglyceridemia Start Tricor, repeat labs prior to next follow-up - fenofibrate (TRICOR) 48 MG tablet; Take 1 tablet (48 mg total) by mouth daily.  Dispense: 90 tablet; Refill: 0 - Lipid Panel With LDL/HDL Ratio  2. Cigarette nicotine dependence without complication Smoking cessation counseling: 1. Pt acknowledges the risks of long term smoking, she will try to quite smoking. 2. Options for different medications including nicotine products, chewing gum, patch etc, Wellbutrin and Chantix is discussed 3. Goal and date of compete cessation is discussed 4. Total time spent in smoking cessation is 15 min.   General Counseling: Mike Nolan  understanding of the findings of todays visit and agrees with plan of treatment. I have discussed any further diagnostic evaluation that may be needed or ordered today. We also reviewed his medications today. he has been encouraged to call the office with any questions or concerns that should arise related to todays visit.    Orders Placed This Encounter  Procedures  . Lipid Panel With LDL/HDL Ratio    Meds ordered this encounter  Medications  . fenofibrate (TRICOR) 48 MG tablet    Sig: Take 1 tablet (48 mg total) by mouth daily.    Dispense:  90 tablet    Refill:  0    Time spent: 30 Minutes Time spent includes review of chart, medications, test results and follow-up plan with the patient.  This patient was seen by Leeanne Deed AGNP-C  in Collaboration with Dr Lyndon Code as a part of collaborative care agreement     Lubertha Basque. Britton Bera AGNP-C Internal medicine

## 2021-01-24 ENCOUNTER — Encounter: Payer: Self-pay | Admitting: Hospice and Palliative Medicine

## 2021-04-25 ENCOUNTER — Ambulatory Visit: Payer: Self-pay | Admitting: Nurse Practitioner

## 2021-06-04 ENCOUNTER — Encounter: Payer: Self-pay | Admitting: Internal Medicine

## 2021-08-01 ENCOUNTER — Other Ambulatory Visit: Payer: Self-pay

## 2021-08-01 ENCOUNTER — Encounter: Payer: Self-pay | Admitting: Nurse Practitioner

## 2021-08-01 ENCOUNTER — Ambulatory Visit: Payer: BC Managed Care – PPO | Admitting: Nurse Practitioner

## 2021-08-01 VITALS — BP 140/90 | HR 72 | Temp 98.6°F | Resp 16 | Ht 72.0 in | Wt 255.0 lb

## 2021-08-01 DIAGNOSIS — J0140 Acute pansinusitis, unspecified: Secondary | ICD-10-CM

## 2021-08-01 DIAGNOSIS — H66003 Acute suppurative otitis media without spontaneous rupture of ear drum, bilateral: Secondary | ICD-10-CM

## 2021-08-01 DIAGNOSIS — F17220 Nicotine dependence, chewing tobacco, uncomplicated: Secondary | ICD-10-CM

## 2021-08-01 DIAGNOSIS — I10 Essential (primary) hypertension: Secondary | ICD-10-CM

## 2021-08-01 DIAGNOSIS — E782 Mixed hyperlipidemia: Secondary | ICD-10-CM | POA: Diagnosis not present

## 2021-08-01 DIAGNOSIS — R300 Dysuria: Secondary | ICD-10-CM | POA: Diagnosis not present

## 2021-08-01 DIAGNOSIS — Z0001 Encounter for general adult medical examination with abnormal findings: Secondary | ICD-10-CM

## 2021-08-01 DIAGNOSIS — E559 Vitamin D deficiency, unspecified: Secondary | ICD-10-CM

## 2021-08-01 DIAGNOSIS — H6690 Otitis media, unspecified, unspecified ear: Secondary | ICD-10-CM

## 2021-08-01 DIAGNOSIS — F17210 Nicotine dependence, cigarettes, uncomplicated: Secondary | ICD-10-CM

## 2021-08-01 MED ORDER — AMOXICILLIN-POT CLAVULANATE 875-125 MG PO TABS: 1.0000 | ORAL_TABLET | Freq: Two times a day (BID) | ORAL | 0 refills | Status: AC

## 2021-08-01 MED ORDER — AMOXICILLIN-POT CLAVULANATE 875-125 MG PO TABS: 1.0000 | ORAL_TABLET | Freq: Two times a day (BID) | ORAL | 0 refills | Status: DC

## 2021-08-01 NOTE — Progress Notes (Signed)
[facility] [place],  [address]  Internal MEDICINE  Office Visit Note  Patient Name: Mike Nolan  [id number]  [id number]  Date of Service: 08/01/2021  Chief Complaint  Patient presents with   Annual Exam   Allergies    HPI Mike Nolan presents for an annual well visit and physical exam. He has a history of postural orthostatic tachycardia syndrome (POTS). He does not have any other significant medical conditions. No significant surgical history. He has no preventive screenings due today. He had covid infection in January and he is feeling much better now. He is due for routine lab work as well. He denies any pain.  He does report a sinus headache x 1 week, sinus pressure runny nose, nasal congestion, sore throat, PND, ear drainage, ear fullness, neck tender, swollen lymph, slight cough, nonproductive No SOB, wheezing, chest tightness. No fatigue or body aches.    Current Medication: Outpatient Encounter Medications as of 08/01/2021  Medication Sig   Ascorbic Acid (VITAMIN C) 1000 MG tablet Take 1,000 mg by mouth daily.   fluticasone (FLONASE) 50 MCG/ACT nasal spray Place 2 sprays into both nostrils daily.   guaiFENesin (MUCINEX) 600 MG 12 hr tablet Take by mouth every morning.   loratadine (CLARITIN) 10 MG tablet Take 10 mg by mouth daily.   niacin 500 MG tablet Take 500 mg by mouth at bedtime.   [DISCONTINUED] amoxicillin-clavulanate (AUGMENTIN) 875-125 MG tablet Take 1 tablet by mouth 2 (two) times daily.   amoxicillin-clavulanate (AUGMENTIN) 875-125 MG tablet Take 1 tablet by mouth 2 (two) times daily.   fenofibrate (TRICOR) 48 MG tablet Take 1 tablet (48 mg total) by mouth daily. (Patient not taking: Reported on 08/01/2021)   No facility-administered encounter medications on file as of 08/01/2021.    Surgical History: Past Surgical History:  Procedure Laterality Date   FOOT SURGERY     NOSE SURGERY     WISDOM TOOTH EXTRACTION       Medical History: Past Medical History:  Diagnosis Date   Allergy    Postural orthostatic tachycardia syndrome     Family History: Family History  Problem Relation Age of Onset   Hypertension Mother    Pancreatic cancer Paternal Grandmother     Social History   Socioeconomic History   Marital status: Married    Spouse name: Not on file   Number of children: Not on file   Years of education: Not on file   Highest education level: Not on file  Occupational History   Not on file  Tobacco Use   Smoking status: Former    Packs/day: 1.00    Types: Cigarettes   Smokeless tobacco: Current    Types: Chew  Substance and Sexual Activity   Alcohol use: Yes    Comment: ocassionally   Drug use: Not Currently   Sexual activity: Not on file  Other Topics Concern   Not on file  Social History Narrative   Not on file   Social Determinants of Health   Financial Resource Strain: Not on file  Food Insecurity: Not on file  Transportation Needs: Not on file  Physical Activity: Not on file  Stress: Not on file  Social Connections: Not on file  Intimate Partner Violence: Not on file      Review of Systems  Constitutional:  Negative for activity change, appetite change, chills, diaphoresis, fatigue, fever and unexpected weight change.  HENT:  Positive for congestion, ear discharge, ear pain, postnasal drip,  rhinorrhea, sinus pressure, sinus pain, sneezing and sore throat. Negative for trouble swallowing.   Eyes: Negative.   Respiratory:  Positive for cough. Negative for chest tightness, shortness of breath and wheezing.   Cardiovascular: Negative.  Negative for chest pain and palpitations.  Gastrointestinal: Negative.  Negative for abdominal pain, blood in stool, constipation, diarrhea, nausea and vomiting.  Endocrine: Negative.   Genitourinary: Negative.  Negative for difficulty urinating, dysuria, frequency, hematuria and urgency.  Musculoskeletal: Negative.  Negative for  arthralgias, back pain, joint swelling, myalgias and neck pain.  Skin: Negative.  Negative for rash and wound.  Allergic/Immunologic: Negative.  Negative for immunocompromised state.  Neurological:  Positive for dizziness and headaches. Negative for seizures and numbness.  Hematological: Negative.   Psychiatric/Behavioral: Negative.  Negative for behavioral problems, self-injury and suicidal ideas. The patient is not nervous/anxious.    Vital Signs: BP 140/90   Pulse 72   Temp 98.6 F (37 C)   Resp 16   Ht 6' (1.829 m)   Wt 255 lb (115.7 kg)   SpO2 96%   BMI 34.58 kg/m    Physical Exam Vitals reviewed.  Constitutional:      General: He is awake. He is not in acute distress.    Appearance: Normal appearance. He is well-developed and well-groomed. He is obese. He is not ill-appearing or diaphoretic.  HENT:     Head: Normocephalic and atraumatic.     Right Ear: Ear canal and external ear normal. Swelling and tenderness present. A middle ear effusion is present. Tympanic membrane is bulging.     Left Ear: Ear canal and external ear normal. Swelling and tenderness present. A middle ear effusion is present. Tympanic membrane is bulging.     Nose: Mucosal edema, congestion and rhinorrhea present. Rhinorrhea is clear.     Right Turbinates: Swollen (and erythematous).     Left Turbinates: Swollen (and erythematous).     Mouth/Throat:     Lips: Pink.     Mouth: Mucous membranes are moist.     Pharynx: Uvula midline. Pharyngeal swelling and posterior oropharyngeal erythema present. No oropharyngeal exudate.     Tonsils: 2+ on the right. 2+ on the left.  Eyes:     General: Lids are normal. Vision grossly intact. Gaze aligned appropriately. No scleral icterus.       Right eye: No discharge.        Left eye: No discharge.     Extraocular Movements: Extraocular movements intact.     Conjunctiva/sclera: Conjunctivae normal.     Pupils: Pupils are equal, round, and reactive to light.      Funduscopic exam:    Right eye: Red reflex present.        Left eye: Red reflex present. Neck:     Thyroid: No thyromegaly.     Vascular: No carotid bruit or JVD.     Trachea: Trachea and phonation normal. No tracheal deviation.  Cardiovascular:     Rate and Rhythm: Normal rate and regular rhythm.     Pulses: Normal pulses.     Heart sounds: Normal heart sounds, S1 normal and S2 normal. No murmur heard.   No friction rub. No gallop.  Pulmonary:     Effort: Pulmonary effort is normal. No accessory muscle usage or respiratory distress.     Breath sounds: Normal breath sounds and air entry. No stridor. No wheezing or rales.  Chest:     Chest wall: No tenderness.  Abdominal:     General:  Bowel sounds are normal. There is no distension.     Palpations: Abdomen is soft. There is no shifting dullness, fluid wave, mass or pulsatile mass.     Tenderness: There is no abdominal tenderness. There is no guarding or rebound.  Musculoskeletal:        General: No tenderness or deformity. Normal range of motion.     Cervical back: Normal range of motion and neck supple.  Lymphadenopathy:     Cervical: Cervical adenopathy present.  Skin:    General: Skin is warm and dry.     Capillary Refill: Capillary refill takes less than 2 seconds.     Coloration: Skin is not pale.     Findings: No erythema or rash.  Neurological:     Mental Status: He is alert and oriented to person, place, and time.     Cranial Nerves: No cranial nerve deficit.     Motor: No abnormal muscle tone.     Coordination: Coordination normal.     Gait: Gait normal.     Deep Tendon Reflexes: Reflexes are normal and symmetric.  Psychiatric:        Mood and Affect: Mood and affect normal.        Behavior: Behavior normal. Behavior is cooperative.        Thought Content: Thought content normal.        Judgment: Judgment normal.       Assessment/Plan: 1. Encounter for general adult medical examination with abnormal  findings Age-appropriate preventive screenings and vaccinations discussed, annual physical exam completed. Routine labs for health maintenance ordered, see below. PHM updated. Not sure for any preventive screenings at this time.   2. Acute non-recurrent pansinusitis Empiric antibiotic treatment prescribed. Encouraged patient to call clinic or come in if no improvement or symptoms worsen in the next 3-7 days. - amoxicillin-clavulanate (AUGMENTIN) 875-125 MG tablet; Take 1 tablet by mouth 2 (two) times daily.  Dispense: 20 tablet; Refill: 0  3. Non-recurrent acute suppurative otitis media of both ears without spontaneous rupture of tympanic membranes Empiric antibiotic treatment prescribed, encouraged patient to call clinic or come in if no improvement or symptoms worsen in the next 3-7 days  - amoxicillin-clavulanate (AUGMENTIN) 875-125 MG tablet; Take 1 tablet by mouth 2 (two) times daily.  Dispense: 20 tablet; Refill: 0  4. Essential hypertension Routine lab ordered. Blood pressure is stable at this time, not currently on medication to control blood pressure.  - CBC with Differential/Platelet  5. Mixed hyperlipidemia Routine labs ordered - Lipid Profile - CMP14+EGFR  6. Vitamin D deficiency Rule out low vitamin D - Vitamin D (25 hydroxy)  7. Chewing tobacco nicotine dependence without complication Former cigarette smoker, switched to tobacco chew, not considering quitting at this time.   8. Dysuria Routine urinalysis done - UA/M w/rflx Culture, Routine - Microscopic Examination      General Counseling: Mike Nolan verbalizes understanding of the findings of todays visit and agrees with plan of treatment. I have discussed any further diagnostic evaluation that may be needed or ordered today. We also reviewed his medications today. he has been encouraged to call the office with any questions or concerns that should arise related to todays visit.    Orders Placed This Encounter   Procedures   Microscopic Examination   UA/M w/rflx Culture, Routine   Lipid Profile   CBC with Differential/Platelet   CMP14+EGFR   Vitamin D (25 hydroxy)    Meds ordered this encounter  Medications   DISCONTD:  amoxicillin-clavulanate (AUGMENTIN) 875-125 MG tablet    Sig: Take 1 tablet by mouth 2 (two) times daily.    Dispense:  20 tablet    Refill:  0   amoxicillin-clavulanate (AUGMENTIN) 875-125 MG tablet    Sig: Take 1 tablet by mouth 2 (two) times daily.    Dispense:  20 tablet    Refill:  0    Return in about 1 year (around 08/01/2022) for CPE, [place] PCP.   Total time spent:30 Minutes Time spent includes review of chart, medications, test results, and follow up plan with the patient.   [place] Controlled Substance Database was reviewed by me.  This patient was seen by Jonetta Osgood, FNP-C in collaboration with Dr. Clayborn Bigness as a part of collaborative care agreement.  Mehkai Gallo R. Valetta Fuller, MSN, FNP-C Internal medicine

## 2021-08-02 LAB — UA/M W/RFLX CULTURE, ROUTINE
Bilirubin, UA: NEGATIVE
Glucose, UA: NEGATIVE
Ketones, UA: NEGATIVE
Leukocytes,UA: NEGATIVE
Nitrite, UA: NEGATIVE
Protein,UA: NEGATIVE
RBC, UA: NEGATIVE
Specific Gravity, UA: 1.016 (ref 1.005–1.030)
Urobilinogen, Ur: 0.2 mg/dL (ref 0.2–1.0)
pH, UA: 6 (ref 5.0–7.5)

## 2021-08-02 LAB — MICROSCOPIC EXAMINATION
Bacteria, UA: NONE SEEN
Casts: NONE SEEN /lpf
Epithelial Cells (non renal): NONE SEEN /hpf (ref 0–10)
RBC: NONE SEEN /hpf (ref 0–2)
WBC, UA: NONE SEEN /hpf (ref 0–5)

## 2022-06-15 ENCOUNTER — Telehealth: Payer: Self-pay | Admitting: Nurse Practitioner

## 2022-06-15 NOTE — Telephone Encounter (Signed)
Sent mychart message to patient to let him know his 07/18/22 physical appointment has been moved to 08/08/22-Toni

## 2022-07-18 ENCOUNTER — Encounter: Payer: BC Managed Care – PPO | Admitting: Nurse Practitioner

## 2022-08-08 ENCOUNTER — Encounter: Payer: BC Managed Care – PPO | Admitting: Nurse Practitioner

## 2024-03-05 ENCOUNTER — Ambulatory Visit: Payer: Self-pay

## 2024-03-05 NOTE — Telephone Encounter (Signed)
   Additional Notes: Patient's mother, Abe Abed, calling in for triage. When asking specific assessment questions she states she doesn't know the answer. She text the questions to the patient and he called her and said he doesn't need her to triage for him and he plans to handle this himself. Mother states he will call back when he is available. Advised his mother of emergent symptoms and for him to go to ED or urgent care for fever, back/flank pain, painful urination, blood in urine. She verbalizes understanding.  Copied from CRM [id number]. Topic: Clinical - Red Word Triage >> Mar 05, 2024  8:38 AM Hamp Levine R wrote: Red Word that prompted transfer to Nurse Triage: Patient has blood in his urine. 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 Answer Assessment - Initial Assessment Questions 1. COLOR of URINE: "Describe the color of the urine."  (e.g., tea-colored, pink, red, bloody) "Do you have blood clots in your urine?" (e.g., none, pea, grape, small coin)     "Dark", unsure what color. Mother "doesn't thinks so" if there are any blood clots in his urine.  2. ONSET: "When did the bleeding start?"      Tuesday.  3. EPISODES: "How many times has there been blood in the urine?" or "How many times today?"     No visible blood seen.  4. PAIN with URINATION: "Is there any pain with passing your urine?" If Yes, ask: "How bad is the pain?"  (Scale 1-10; or mild, moderate, severe)    - MILD: Complains slightly about urination hurting.    - MODERATE: Interferes with normal activities.      - SEVERE: Excruciating, unwilling or unable to urinate because of the pain.      No.  5. FEVER: "Do you have a fever?" If Yes, ask: "What is your temperature, how was it measured, and when did it start?"     Unsure.  6. ASSOCIATED SYMPTOMS: "Are you passing urine more frequently than usual?"     *No Answer*  7. OTHER SYMPTOMS: "Do you have any other symptoms?" (e.g., back/flank pain, abdomen pain,  vomiting)     *No Answer*  8. PREGNANCY: "Is there any chance you are pregnant?" "When was your last menstrual period?"     N/A.  Protocols used: Urine - Blood In-A-AH
